# Patient Record
Sex: Male | Born: 1955 | Race: White | Hispanic: No | Marital: Married | Smoking: Never smoker
Health system: Southern US, Community
[De-identification: ages and names within clinical notes are randomized; demographics above are authoritative.]

## PROBLEM LIST (undated history)

## (undated) DIAGNOSIS — G4730 Sleep apnea, unspecified: Secondary | ICD-10-CM

## (undated) DIAGNOSIS — I219 Acute myocardial infarction, unspecified: Secondary | ICD-10-CM

## (undated) DIAGNOSIS — I209 Angina pectoris, unspecified: Secondary | ICD-10-CM

## (undated) DIAGNOSIS — M1990 Unspecified osteoarthritis, unspecified site: Secondary | ICD-10-CM

## (undated) DIAGNOSIS — I2510 Atherosclerotic heart disease of native coronary artery without angina pectoris: Secondary | ICD-10-CM

## (undated) DIAGNOSIS — K219 Gastro-esophageal reflux disease without esophagitis: Secondary | ICD-10-CM

## (undated) DIAGNOSIS — I10 Essential (primary) hypertension: Secondary | ICD-10-CM

## (undated) DIAGNOSIS — E1149 Type 2 diabetes mellitus with other diabetic neurological complication: Secondary | ICD-10-CM

## (undated) DIAGNOSIS — J449 Chronic obstructive pulmonary disease, unspecified: Secondary | ICD-10-CM

---

## 2000-05-15 ENCOUNTER — Observation Stay (HOSPITAL_COMMUNITY): Admission: EM | Admit: 2000-05-15 | Discharge: 2000-05-16 | Payer: Self-pay | Admitting: Cardiology

## 2007-04-19 ENCOUNTER — Emergency Department (HOSPITAL_COMMUNITY): Admission: EM | Admit: 2007-04-19 | Discharge: 2007-04-19 | Payer: Self-pay | Admitting: Emergency Medicine

## 2010-10-21 HISTORY — PX: CORONARY ARTERY BYPASS GRAFT: SHX141

## 2010-10-21 HISTORY — PX: CORONARY ANGIOPLASTY: SHX604

## 2011-03-08 NOTE — Cardiovascular Report (Signed)
[facility]  Patient:    Roy James, Roy James                        MRN: [id number] Proc. Date: 05/16/00 Adm. Date:  [id number] Disc. Date: [id number] Attending:  Learta Codding CC:         [name] Margo Common, M.D., [place], [place]             Lewayne Bunting, M.D.             [facility], [place], [place]             Cardiac Catheterization Lab                        Cardiac Catheterization  PROCEDURE PERFORMED:  Left heart catheterization with coronary angiography and left ventriculography.  INDICATIONS:  Mr. Mozingo is a 55 year old male who was admitted to [facility] with chest pain worrisome for unstable angina.  He was transferred to [facility] for cardiac catheterization.  PROCEDURAL NOTE:  A 6-French sheath was placed in the right femoral artery. Standard Judkins 6-French catheters were utilized.  Contrast was Omnipaque. At the conclusion of the procedure, a Perclose vascular closure device was placed in the right femoral artery with good hemostasis.  There were no complications.  RESULTS:  Hemodynamics:  Left ventricular pressure 116/10.  Aortic pressure 130/72. There was no aortic valve gradient.  Left ventriculogram:  Wall motion is normal.  Ejection fraction is greater than 65%.  No mitral regurgitation.  Coronary arteriography (right dominant): 1. The left main has minor luminal irregularities. 2. The left anterior descending artery has a 30% stenosis in the proximal    vessel.  It gives rise to a normal sized branching first diagonal and a    small second diagonal branch. 3. The left circumflex has a 20% stenosis in the mid vessel.  In the distal    vessel just before OM-3 is a tubular 60-70% stenosis.  The circumflex gives    rise to a small OM-1, small OM-2, and a large OM-3. 4. The right coronary artery is a dominant vessel.  The ostium in the vessel    has a 20% stenosis which may be partly due to catheter-induced spasm.  This    did  improve with nitroglycerin.  In the proximal vessel is a 20% stenosis,    and the mid vessel has a minor luminal irregularity.  The distal right    coronary artery gives rise to a large posterior descending artery, a small    first posterolateral branch, and a normal second posterolateral branch, and    a small third posterolateral branch.  IMPRESSIONS: 1. Normal left ventricular systolic function. 2. Nonobstructive coronary artery disease. DD:  05/16/00 TD:  05/16/00 Job: [id number]

## 2011-03-08 NOTE — Discharge Summary (Signed)
[place]  Patient:    Roy James, Roy James                        MRN: [id number] Adm. Date:  [id number] Disc. Date: [id number] Attending:  Learta Codding Dictator:   Abelino Derrick, P.A.C. [facility]                  Referring Physician Discharge [place]  DISCHARGE DIAGNOSES: 1. Chest pain, worrisome for coronary disease. 2. Nonobstructive coronary disease by catheterization this admission. 3. Obstructive sleep apnea.  HOSPITAL COURSE:  Mr. Thune is a 55 year old male followed by Dr. Wyvonnia Lora.  He has a history of obstructive sleep apnea and is on CPAP at home. He was seen by Dr. Myrtis Ser and Arnette Felts May 14, 2000 in [place].  He was set up for catheterization and further evaluation.  Catheterization was done May 16, 2000 by Dr. Gerri Spore which revealed a 60% mid circumflex narrowing.  He has EF of 65%.  It is felt he has nonobstructive coronary disease.  After admission, he did have an episode where he had some pauses on telemetry, although he was not using his CPAP that he usually uses at home.  Dr. Gerri Spore suggested a 48-hour Holter and follow-up at [facility] with Dr. Myrtis Ser.  DISCHARGE MEDICATIONS: 1. Protonix 40 mg q.d. 2. Aspirin q.d. Marland Kitchen 3. Atenolol 25 mg a day.  LABORATORY DATA:  Sodium 138, potassium 4.1, BUN 13, creatinine 1.0.  Lipid profile shows an LDL of 16, HDL 29.  EKG shows sinus rhythm without acute changes.  CK-MBs and troponins from [facility] are negative.  INR is 1.1.  Sodium 139, potassium 3.7, BUN 14, creatinine 1.4.  White count 12.4, hemoglobin 14.7, hematocrit 43.1, platelets 210.  DISPOSITION:  Patient is discharged in stable condition and will follow up with Dr. Myrtis Ser in [place].  He will have a Holter done.  He will pick this up in the office today. DD:  05/16/00 TD:  05/16/00 Job: [id number]

## 2011-10-22 HISTORY — PX: CARDIAC CATHETERIZATION: SHX172

## 2013-08-17 ENCOUNTER — Encounter: Payer: Self-pay | Admitting: Cardiology

## 2013-08-17 ENCOUNTER — Observation Stay (HOSPITAL_COMMUNITY)
Admit: 2013-08-17 | Discharge: 2013-08-18 | Disposition: A | Discharge status: Home / Self Care | Payer: Self-pay | Source: Other Acute Inpatient Hospital | Attending: Cardiology | Admitting: Cardiology

## 2013-08-17 ENCOUNTER — Encounter (HOSPITAL_COMMUNITY): Payer: Self-pay | Admitting: *Deleted

## 2013-08-17 DIAGNOSIS — E1149 Type 2 diabetes mellitus with other diabetic neurological complication: Secondary | ICD-10-CM | POA: Insufficient documentation

## 2013-08-17 DIAGNOSIS — Z79899 Other long term (current) drug therapy: Secondary | ICD-10-CM | POA: Insufficient documentation

## 2013-08-17 DIAGNOSIS — Z951 Presence of aortocoronary bypass graft: Secondary | ICD-10-CM | POA: Insufficient documentation

## 2013-08-17 DIAGNOSIS — J449 Chronic obstructive pulmonary disease, unspecified: Secondary | ICD-10-CM | POA: Insufficient documentation

## 2013-08-17 DIAGNOSIS — I2510 Atherosclerotic heart disease of native coronary artery without angina pectoris: Principal | ICD-10-CM | POA: Insufficient documentation

## 2013-08-17 DIAGNOSIS — I10 Essential (primary) hypertension: Secondary | ICD-10-CM | POA: Insufficient documentation

## 2013-08-17 DIAGNOSIS — M109 Gout, unspecified: Secondary | ICD-10-CM | POA: Insufficient documentation

## 2013-08-17 DIAGNOSIS — R079 Chest pain, unspecified: Secondary | ICD-10-CM | POA: Insufficient documentation

## 2013-08-17 DIAGNOSIS — Z9861 Coronary angioplasty status: Secondary | ICD-10-CM | POA: Insufficient documentation

## 2013-08-17 DIAGNOSIS — E1142 Type 2 diabetes mellitus with diabetic polyneuropathy: Secondary | ICD-10-CM | POA: Insufficient documentation

## 2013-08-17 DIAGNOSIS — Z23 Encounter for immunization: Secondary | ICD-10-CM | POA: Insufficient documentation

## 2013-08-17 DIAGNOSIS — J4489 Other specified chronic obstructive pulmonary disease: Secondary | ICD-10-CM | POA: Insufficient documentation

## 2013-08-17 DIAGNOSIS — E785 Hyperlipidemia, unspecified: Secondary | ICD-10-CM | POA: Insufficient documentation

## 2013-08-17 DIAGNOSIS — G4733 Obstructive sleep apnea (adult) (pediatric): Secondary | ICD-10-CM | POA: Insufficient documentation

## 2013-08-17 DIAGNOSIS — I209 Angina pectoris, unspecified: Secondary | ICD-10-CM | POA: Insufficient documentation

## 2013-08-17 DIAGNOSIS — E669 Obesity, unspecified: Secondary | ICD-10-CM

## 2013-08-17 HISTORY — DX: Essential (primary) hypertension: I10

## 2013-08-17 HISTORY — DX: Unspecified osteoarthritis, unspecified site: M19.90

## 2013-08-17 HISTORY — DX: Angina pectoris, unspecified: I20.9

## 2013-08-17 HISTORY — DX: Gastro-esophageal reflux disease without esophagitis: K21.9

## 2013-08-17 HISTORY — DX: Atherosclerotic heart disease of native coronary artery without angina pectoris: I25.10

## 2013-08-17 HISTORY — DX: Chronic obstructive pulmonary disease, unspecified: J44.9

## 2013-08-17 HISTORY — DX: Type 2 diabetes mellitus with other diabetic neurological complication: E11.49

## 2013-08-17 HISTORY — DX: Sleep apnea, unspecified: G47.30

## 2013-08-17 MED ORDER — GABAPENTIN 300 MG PO CAPS
300.0000 mg | ORAL_CAPSULE | Freq: Three times a day (TID) | ORAL | Status: DC
  Administered 2013-08-18 (×2): 300 mg via ORAL
  Filled 2013-08-17 (×4): qty 1

## 2013-08-17 MED ORDER — PRASUGREL HCL 10 MG PO TABS
10.0000 mg | ORAL_TABLET | Freq: Every day | ORAL | Status: DC
  Administered 2013-08-18: 11:00:00 10 mg via ORAL
  Filled 2013-08-17: qty 1

## 2013-08-17 MED ORDER — MOMETASONE FURO-FORMOTEROL FUM 100-5 MCG/ACT IN AERO
2.0000 | INHALATION_SPRAY | Freq: Two times a day (BID) | RESPIRATORY_TRACT | Status: DC
  Administered 2013-08-18 (×2): 2 via RESPIRATORY_TRACT
  Filled 2013-08-17: qty 8.8

## 2013-08-17 MED ORDER — ASPIRIN EC 81 MG PO TBEC
81.0000 mg | DELAYED_RELEASE_TABLET | Freq: Every day | ORAL | Status: DC
  Administered 2013-08-18: 81 mg via ORAL
  Filled 2013-08-17: qty 1

## 2013-08-17 MED ORDER — ACETAMINOPHEN 325 MG PO TABS: 650.0000 mg | ORAL_TABLET | ORAL | Status: DC | PRN

## 2013-08-17 MED ORDER — METOPROLOL TARTRATE 25 MG PO TABS
25.0000 mg | ORAL_TABLET | Freq: Two times a day (BID) | ORAL | Status: DC
  Administered 2013-08-18 (×2): 25 mg via ORAL
  Filled 2013-08-17 (×3): qty 1

## 2013-08-17 MED ORDER — INSULIN ASPART 100 UNIT/ML ~~LOC~~ SOLN: 0.0000 [IU] | Freq: Three times a day (TID) | SUBCUTANEOUS | Status: DC

## 2013-08-17 MED ORDER — ONDANSETRON HCL 4 MG/2ML IJ SOLN: 4.0000 mg | Freq: Four times a day (QID) | INTRAMUSCULAR | Status: DC | PRN

## 2013-08-17 MED ORDER — AMLODIPINE BESYLATE 10 MG PO TABS
10.0000 mg | ORAL_TABLET | Freq: Every day | ORAL | Status: DC
  Administered 2013-08-18: 14:00:00 10 mg via ORAL
  Filled 2013-08-17: qty 1

## 2013-08-17 MED ORDER — NITROGLYCERIN 0.4 MG SL SUBL
0.4000 mg | SUBLINGUAL_TABLET | SUBLINGUAL | Status: DC | PRN
  Administered 2013-08-18: 0.4 mg via SUBLINGUAL
  Filled 2013-08-17: qty 25

## 2013-08-17 MED ORDER — SIMVASTATIN 20 MG PO TABS: 20.0000 mg | ORAL_TABLET | Freq: Every day | ORAL | Status: DC

## 2013-08-17 MED ORDER — INFLUENZA VAC SPLIT QUAD 0.5 ML IM SUSP
0.5000 mL | INTRAMUSCULAR | Status: AC
  Administered 2013-08-18: 13:00:00 0.5 mL via INTRAMUSCULAR
  Filled 2013-08-17: qty 0.5

## 2013-08-17 MED ORDER — ISOSORBIDE MONONITRATE ER 30 MG PO TB24
30.0000 mg | ORAL_TABLET | Freq: Every day | ORAL | Status: DC
  Administered 2013-08-18: 30 mg via ORAL
  Filled 2013-08-17: qty 1

## 2013-08-17 MED ORDER — ASPIRIN 81 MG PO CHEW
324.0000 mg | CHEWABLE_TABLET | ORAL | Status: AC
  Administered 2013-08-18: 324 mg via ORAL
  Filled 2013-08-17: qty 4

## 2013-08-17 MED ORDER — LOSARTAN POTASSIUM 25 MG PO TABS
25.0000 mg | ORAL_TABLET | Freq: Every day | ORAL | Status: DC
  Administered 2013-08-18: 11:00:00 25 mg via ORAL
  Filled 2013-08-17: qty 1

## 2013-08-17 MED ORDER — SIMVASTATIN 40 MG PO TABS
40.0000 mg | ORAL_TABLET | Freq: Every day | ORAL | Status: DC
  Filled 2013-08-17: qty 1

## 2013-08-17 NOTE — H&P (Signed)
History and Physical  Patient ID: Roy James MRN: [id number], SOB: Aug 06, 1956 57 y.o. Date of Encounter: 08/17/2013, 10:48 PM  Primary Physician: No primary provider on file. Primary Cardiologist: Dr. Ladell Pier  Chief Complaint: chest pain  HPI: 57 y.o. male w/ PMHx significant for CAD s/p CABG 2012, s/p stents 08/2011, diabetes, HTN who initially presented to [facility] and transferred to  [facility] on 08/17/2013 with complaints of a single episode of chest pain.  He describes it as similar to cardiac chest pain that he has had in the past- substernal, radiating to his left arm and up into his neck, assoc with feeling clammy and shortness of breath. Occurred while he was walking from the kitchen to living room. His wife was concerned also that he didn't look good. Took 1 nitro (old but fizzed) and sought emergent care. Chest pain resolved in the ER after Lovenox 100 subq, morphine 2, nitro paste 1 inch. He has not had recurrence since.  He occasionally gets chest pain but rarely uses a nitro. Mows his yard and helps out with chores and is limited more by shortness of breath.  He has had difficulty affording his medications and has been reliant on his cardiologist for samples. He is a little unclear on which meds he takes but it sounds like he ran out of effient and norvasc in the past two days.  He is currently comfortable and chest pain free.   He wanted to be transferred to [facility] but transport wouldn't cross state lines.  Labs: Glucose 151 Cr 1.3 NA 135 K of 3.5 Troponin neg cpk 73 (nl) bnp 20 inr 0.9 Wbc 9.7 hct 43 plts 201  Cxray: cardiomegaly but no acute   EKG revealed NSR, PVC, with no acute ST changes.  l  PMH: 1. CAD s/p CABG 2012 (3v LIMA to LAD, SVG to OM2, SVG to D1), s/p stent 08/2011 to OM2 2. HTN 3. Diabetes 4. COPD 5. Neuropathy 6. Gout 7. Sleep apnea   Home Meds: From ER records and pt --> he is a little unclear on his  meds.  Prior to Admission medications   Not on File  Metformin 500 qday Aspirin 81 qda advair 1 inh bid Metoprolol 25 bid Amlodipine 10 qd effient 10 mg Losartan 25 mg qd imdur dose  Allergies: Allergies not on file  History   Social History  . Marital Status: Legally Separated    Spouse Name: N/A    Number of Children: N/A  . Years of Education: N/A   Occupational History  . Not on file.   Social History Main Topics  . Smoking status: Not on file  . Smokeless tobacco: Not on file  . Alcohol Use: Not on file  . Drug Use: Not on file  . Sexual Activity: Not on file   Other Topics Concern  . Not on file   Social History Narrative  . No narrative on file     No family history on file.  Review of Systems: General: negative for chills, fever, night sweats or weight changes.  Cardiovascular: see HPI Dermatological: negative for rash Respiratory: negative for cough or wheezing Urologic: negative for hematuria Abdominal: negative for nausea, vomiting, diarrhea, bright red blood per rectum, melena, or hematemesis Neurologic: negative for visual changes, syncope, or dizziness All other systems reviewed and are otherwise negative except as noted above.  Labs:see HPI.   EKG: EKG revealed NSR, PVC, with no acute ST changes.  Physical Exam: Blood  pressure 148/126, temperature 98.4 F (36.9 C), temperature source Oral, resp. rate 18. General: Well developed, well nourished, in no acute distress. Head: Normocephalic, atraumatic, sclera non-icteric, nares are without discharge Neck: Supple. Negative for carotid bruits. JVD not elevated. Lungs: Clear bilaterally to auscultation without wheezes, rales, or rhonchi. Breathing is unlabored. Heart: RRR with S1 S2. No murmurs, rubs, or gallops appreciated. Surgical scar. Abdomen: Soft, non-tender, non-distended with normoactive bowel sounds. Protuberant. No rebound/guarding. No obvious abdominal masses. Msk:  Strength and tone  appear normal for age. Extremities: No edema. No clubbing or cyanosis. Distal pedal pulses are 2+ and equal bilaterally. Neuro: Alert and oriented X 3. Moves all extremities spontaneously. Psych:  Responds to questions appropriately with a normal affect.   Problem List 1. Chest pain, consistent with cardiac angina 2. Known CAD s/p CABG, stent 3. DM 4. HTN 5. Hyperlipidemia 6. COPD 7. Difficulty affording medications.  ASSESSMENT AND PLAN:  57 y.o. male w/ PMHx significant for CAD s/p CABG 2012, s/p stents 08/2011, diabetes, HTN who initially presented to [facility] and transferred to  [facility] on 08/17/2013 with complaints of a single episode of chest pain that is consistent with cardiac angina.  Encouragingly, he has been chest pain free since ER arrival and has negative biomarkers and no EKG changes. Plan is to watch overnight with serial enzymes, telemetry and re-assess in the AM. Received lovenox at outside ER. Without recurrence of symptoms or elevated biomarkers or EKG changes, will not continue lovenox. If he remains chest pain free and biomarker negative, it is reasonable to risk stratify with noninvasive testing in the AM or in the near future as outpatient.  Continue beta blocker, statin, ARB, long acting nitrate, aspirin, prasugrel.  Sliding scale for his diabetes, hold metformin. Gabapentin for neuropathy.  Continue COPD inhalers.   Prophylaxis: Covered by initial lovenox dose. NPO for possible stress.  Full code.  Signed, Adolm Joseph, Troy Sine MD 08/17/2013, 10:48 PM

## 2013-08-18 ENCOUNTER — Encounter (HOSPITAL_COMMUNITY): Payer: Self-pay | Admitting: *Deleted

## 2013-08-18 DIAGNOSIS — E1149 Type 2 diabetes mellitus with other diabetic neurological complication: Secondary | ICD-10-CM

## 2013-08-18 DIAGNOSIS — E785 Hyperlipidemia, unspecified: Secondary | ICD-10-CM

## 2013-08-18 DIAGNOSIS — I209 Angina pectoris, unspecified: Secondary | ICD-10-CM

## 2013-08-18 DIAGNOSIS — E119 Type 2 diabetes mellitus without complications: Secondary | ICD-10-CM | POA: Insufficient documentation

## 2013-08-18 DIAGNOSIS — J449 Chronic obstructive pulmonary disease, unspecified: Secondary | ICD-10-CM

## 2013-08-18 DIAGNOSIS — I2510 Atherosclerotic heart disease of native coronary artery without angina pectoris: Secondary | ICD-10-CM

## 2013-08-18 DIAGNOSIS — E669 Obesity, unspecified: Secondary | ICD-10-CM

## 2013-08-18 DIAGNOSIS — I10 Essential (primary) hypertension: Secondary | ICD-10-CM

## 2013-08-18 LAB — LIPID PANEL
HDL: 47 mg/dL (ref >39)
Triglycerides: 112 mg/dL (ref <150)

## 2013-08-18 LAB — TROPONIN I
Troponin I: <0.3 ng/mL (ref <0.30)
Troponin I: <0.3 ng/mL (ref <0.30)

## 2013-08-18 LAB — CBC
HCT: 40.7 % (ref 39.0–52.0)
Hemoglobin: 13.9 g/dL (ref 13.0–17.0)
MCHC: 34.2 g/dL (ref 30.0–36.0)
MCV: 92.5 fL (ref 78.0–100.0)
Platelets: 190 10*3/uL (ref 150–400)
RDW: 13.9 % (ref 11.5–15.5)
WBC: 8.5 10*3/uL (ref 4.0–10.5)

## 2013-08-18 LAB — BASIC METABOLIC PANEL
Calcium: 8.7 mg/dL (ref 8.4–10.5)
Chloride: 102 mEq/L (ref 96–112)
GFR calc non Af Amer: 65 mL/min — ABNORMAL LOW (ref >90)
Glucose, Bld: 89 mg/dL (ref 70–99)

## 2013-08-18 LAB — GLUCOSE, CAPILLARY
Glucose-Capillary: 94 mg/dL (ref 70–99)
Glucose-Capillary: 94 mg/dL (ref 70–99)

## 2013-08-18 LAB — HEMOGLOBIN A1C
Hgb A1c MFr Bld: 5.7 % — ABNORMAL HIGH (ref <5.7)
Mean Plasma Glucose: 117 mg/dL — ABNORMAL HIGH (ref <117)

## 2013-08-18 MED ORDER — ACTIVE PARTNERSHIP FOR HEALTH OF YOUR HEART BOOK
Freq: Once | Status: AC
  Administered 2013-08-18: 01:00:00
  Filled 2013-08-18 (×2): qty 1

## 2013-08-18 MED ORDER — AMLODIPINE BESYLATE 5 MG PO TABS: 5.0000 mg | ORAL_TABLET | Freq: Every day | ORAL | Status: DC

## 2013-08-18 MED ORDER — SIMVASTATIN 40 MG PO TABS: 40.0000 mg | ORAL_TABLET | Freq: Every day | ORAL | Status: DC

## 2013-08-18 MED ORDER — ALUM & MAG HYDROXIDE-SIMETH 200-200-20 MG/5ML PO SUSP
15.0000 mL | ORAL | Status: DC | PRN
Start: 2013-08-18 — End: 2013-08-18
  Administered 2013-08-18: 08:00:00 15 mL via ORAL
  Filled 2013-08-18: qty 30

## 2013-08-18 MED ORDER — ISOSORBIDE MONONITRATE ER 60 MG PO TB24: 60.0000 mg | ORAL_TABLET | Freq: Every day | ORAL | Status: DC

## 2013-08-18 MED ORDER — NITROGLYCERIN 0.4 MG SL SUBL: 0.4000 mg | SUBLINGUAL_TABLET | SUBLINGUAL | Status: DC | PRN

## 2013-08-18 NOTE — Progress Notes (Signed)
Patient Name: Roy James Date of Encounter: 08/18/2013     Principal Problem:   Angina, class III Active Problems:   CAD (coronary artery disease)   Type 2 diabetes mellitus   Hypertension   Hyperlipidemia   COPD (chronic obstructive pulmonary disease)    SUBJECTIVE: The patient is currently comfortable and pain free. He had an episode of cp this AM, 7/10. Unresponsive to NTG SL x 1. Hypotensive. Given Maalox. Eventually reduced to 5/10.   He reports undergoing cardiac cath in [place] within the past year which was "normal." He did not receive at stent. He reports experiencing substernal dull chest pain for months which occur once/week and last 15-20 minutes. Typically occuring at rest, but occasionally on exertion. Each episode responds to NTG x 1-2. He had another episode yesterday with radiation to his neck and back with associated dyspnea and diaphoresis. He took an old NTG SL with some improvement. He thought the episode was initially a COPD flare. His wife was concerned and he presented to the [place]. Requested transfer to [place], but unable to cross state lines.   OBJECTIVE  Filed Vitals:   08/18/13 0736 08/18/13 0752 08/18/13 0753 08/18/13 0800  BP: 99/71 89/45 95/46  103/67  Pulse: 72 76 82 63  Temp:    98.4 F (36.9 C)  TempSrc:    Oral  Resp:    18  Height:      Weight:      SpO2: 97% 94% 93% 95%    Intake/Output Summary (Last 24 hours) at 08/18/13 1048 Last data filed at 08/18/13 4098  Gross per 24 hour  Intake     20 ml  Output    825 ml  Net   -805 ml   Weight change:   PHYSICAL EXAM  General: Well developed, obese appearing male in no acute distress. Head: Normocephalic, atraumatic, sclera non-icteric, no xanthomas, nares are without discharge.  Neck: Supple without bruits or JVD. Lungs:  Resp regular and unlabored, CTAB w/o wheezes, rales or rhonchi Heart: Chest discomfort on palpation, different in character from presenting chest  pain. RRR no s3, s4, or murmurs. Abdomen: Soft, non-tender, non-distended, BS + x 4.  Msk:  Strength and tone appears normal for age. Extremities: No clubbing, cyanosis or edema. DP/PT/Radials 2+ and equal bilaterally. Neuro: Alert and oriented X 3. Moves all extremities spontaneously. Psych: Normal affect.  LABS:  Recent Labs     08/18/13  0452  WBC  8.5  HGB  13.9  HCT  40.7  MCV  92.5  PLT  190    Recent Labs Lab 08/18/13 0452  NA 138  K 4.2  CL 102  CO2 24  BUN 18  CREATININE 1.22  CALCIUM 8.7  GLUCOSE 89   Recent Labs     08/17/13  2351  08/18/13  0517  TROPONINI  <0.30  <0.30   Recent Labs     08/18/13  0452  CHOL  192  HDL  47  LDLCALC  123*  TRIG  112  CHOLHDL  4.1   TELE: NSR, HR 70-80s  ECG: NSR, 62 bpm, no ST/T changes, inferior IVCD   Radiology/Studies:  No results found.  Current Medications:  . amLODipine  10 mg Oral Daily  . aspirin EC  81 mg Oral Daily  . gabapentin  300 mg Oral TID  . influenza vac split quadrivalent PF  0.5 mL Intramuscular Tomorrow-1000  . insulin aspart  0-15 Units Subcutaneous TID WC  .  isosorbide mononitrate  30 mg Oral Daily  . losartan  25 mg Oral Daily  . metoprolol tartrate  25 mg Oral BID  . mometasone-formoterol  2 puff Inhalation BID  . prasugrel  10 mg Oral Daily  . simvastatin  40 mg Oral q1800    ASSESSMENT AND PLAN:  1. Angina, class III- the patient experiences consistent chest pressure mostly while at rest once per week which responds to NTG SL. He presented to the ED yesterday after his wife became concerned. He reports these episodes have not become more frequent or severe. He states he had a cardiac cath in [place], [place] within a year which was "normal." No PCI performed; however, he is on Effient (indefinite DAPT?). He receives cardiac care up there. He is on multiple antianginals suggesting chronic angina.  -- Plan outpatient follow-up with cardiologist to consider noninvasive ischemic  eval. D/c today.  -- Increase Imdur to 60mg  daily -- Reduce amlodipine to 5mg  give more room for BP  2. CAD s/p CABG, PCI in 2012- ruled out overnight. No ischemic changes on EKG this AM. Chest discomfort this AM. Unresponsive to NTG. Unclear that this morning's episode was cardiac pain. Did eventually abate after Maalox given. Did not feel like indigestion however. He also has COPD and diabetic neuropathy which could attribute to his discomfort, dyspnea.  -- Continue ASA, Effient, ARB, BB, statin, Imdur, Norvasc -- Upgrade to high-intensity statin (on simvastatin at home)  3. COPD- no cough, fevers, chills or wheezing. Stable.  -- Continue current management  4. Type 2 DM with diabetic neuropathy- on metformin at home.  -- Follow-up PCP -- Continue gapapentin  5. Hypertension- actually labile BPs this AM.  -- Norvasc adjustment as above to accommodate increasing Imdur.      Signed, R. Hurman Horn, PA-C 08/18/2013, 10:48 AM

## 2013-08-18 NOTE — Progress Notes (Signed)
   I have seen and examined the patient. I agree with the above note with the addition of : The patient reports chronic chest pain after his CABG. Cardiac cath done early this year showed no obstructive disease (per patient).  He ruled out for MI overnight with no recurrent chest pain. ECG is normal. I discussed management options with him including inpatient vs. outpatient ischemic evaluation. He prefers outpatient work up which I think is reasonable. Recommend a nuclear stress test done within 1 week. He wants to have this done with his cardiologist in [place]. Continue medical therapy for CAD.   Lorine Bears MD, [facility] 08/18/2013 3:11 PM

## 2013-08-18 NOTE — Discharge Summary (Signed)
Discharge Summary   Patient ID: Roy James,  MRN: [id number], DOB/AGE: 1955/12/20 57 y.o.  Admit date: 08/17/2013 Discharge date: 08/18/2013  Primary Physician: No primary provider on file. Primary Cardiologist: Dr. Darlyn James in [place], [place]  Discharge Diagnoses  Principal Problem:   Angina, class III  - Increase Imdur to 60mg  daily  - Reduce amlodipine to 64m daily  - Follow-up with Dr. Catha James on 08/25/13  Active Problems:   CAD (coronary artery disease)  - Trop-I WNL x 3  - No ischemic EKG changes   Hypertension  - Antihypertensive adjustments as above   Hyperlipidemia  - LDL 123, HDL 47, TG 112, TC 192  - Continue simvasatin 40 for now (consider upgrading to high-intensity statin given new guidelines and risk of myopathy at simvastatin doses > 20mg  daily with amlodipine)   COPD (chronic obstructive pulmonary disease)   Obesity   Type 2 diabetes mellitus with neurological manifestations  Allergies Allergies  Allergen Reactions  . Penicillins     Diagnostic Studies/Procedures  None  History of Present Illness  Roy James is a 57 y.o. Caucasian male who was observed overnight at [facility] 10/28 to 08/18/2013 with the above problem list.   He has a history of CAD s/p CABG & subsequent PCI in 2012, DM2 with diabetic neuropathy, HTN, HLD, obesity, OSA and COPD. He has had difficulties affording certain medications. He follows with Dr. Catha James in [place], [place].   He reported undergoing cardiac cath in [place] within the past year which was "normal." He did not receive at stent. He endorses substernal dull chest pain for months which occur once/week and last 15-20 minutes. Typically occuring at rest, but occasionally on exertion. Each episode responds to NTG x 1-2. He had another episode 10/28 with radiation to his neck and back and associated dyspnea and diaphoresis. He took an old NTG SL with some improvement. He thought the  episode was initially a COPD flare. His wife was concerned and he presented to the [place]. Requested transfer to [place], but unable to cross state lines, so was transferred to [facility].   There, EKG indicated NSR with no acute ischemic changes. Initial TnI returned WNL. BMET and CBC were largely WNL. Lipid panel was obtained revealing LDL 123, HDL 47, TG 112, TC 192. Given his cardiac history, RFs and HPI c/w angina, he was observed overnight for formal rule.   Hospital Course   He was continued on his home medications. A subsequent troponin returned WNL. He did have an episode of chest pain this morning rated at a 7/10. He took NTG SL with minimal relief. BP was soft in the 90/60s so a subsequent NTG was not administered. He was instead given Maalox with eventual relief. EKG this morning indicated no ST/T changes. A final troponin returned WNL. His symptoms were consistent with chronic stable angina. With a "normal" cardiac cath earlier this year, formal rule out this hospitalization and no true change in the frequency or severity of the patient's chest discomfort, the plan was made for discharge and to follow-up with his cardiologist within 1 week to consider the utility of noninvasive ischemic evaluation. He is on several antianginals- Imdur, metoprolol, amlodipine- and the strategy may be to treat his chronic angina. NTG typically improves the chest discomfort, therefore we have increase Imdur to 60mg  daily and reduced amlodipine to 5mg  daily to give room from a BP standpoint. He will be continued on all home medications.   Of  note, he is on simvastatin and amlodipine. There is increased risk of statin-induced myopathy with simvastatin > 20mg  and concomitant amlodipine use. Given the new ACC/AHA guidelines, the patient may benefit from upgrading to a high-intensity statin such as atorvastatin 80 or rosuvastatin 20. Will leave to the discretion of the patient's cardiologist. He has a previously  scheduled appointment on 11/5. This will be kept.    This information has been clearly explained to the patient and reflected in the discharge AVS.   Discharge Vitals:  Blood pressure 105/59, pulse 85, temperature 98.4 F (36.9 C), temperature source Oral, resp. rate 18, height 6' (1.829 m), weight 105.5 kg (232 lb 9.4 oz), SpO2 97.00%.   Labs: Recent Labs     08/18/13  0452  WBC  8.5  HGB  13.9  HCT  40.7  MCV  92.5  PLT  190    Recent Labs Lab 08/18/13 0452  NA 138  K 4.2  CL 102  CO2 24  BUN 18  CREATININE 1.22  CALCIUM 8.7  GLUCOSE 89   Recent Labs     08/17/13  2351  08/18/13  0517  08/18/13  1100  TROPONINI  <0.30  <0.30  <0.30   Recent Labs     08/18/13  0452  CHOL  192  HDL  47  LDLCALC  123*  TRIG  112  CHOLHDL  4.1   Disposition:  Discharge Orders   Future Orders Complete By Expires   Diet - low sodium heart healthy  As directed    Increase activity slowly  As directed          Follow-up Information   Follow up On 08/25/2013. (You have an appointment previously scheduled with your primary cardiologist Dr. Catha James on this date. Please keep this appointment. Call the office if you are unsure of the time. )      Discharge Medications:    Medication List         amLODipine 5 MG tablet  Commonly known as:  NORVASC  Take 1 tablet (5 mg total) by mouth daily.     aspirin EC 81 MG tablet  Take 81 mg by mouth daily.     Fluticasone-Salmeterol 250-50 MCG/DOSE Aepb  Commonly known as:  ADVAIR  Inhale 1 puff into the lungs every 12 (twelve) hours.     gabapentin 300 MG capsule  Commonly known as:  NEURONTIN  Take 300 mg by mouth 3 (three) times daily.     isosorbide mononitrate 60 MG 24 hr tablet  Commonly known as:  IMDUR  Take 1 tablet (60 mg total) by mouth daily.     losartan 25 MG tablet  Commonly known as:  COZAAR  Take 25 mg by mouth daily.     metFORMIN 500 MG tablet  Commonly known as:  GLUCOPHAGE  Take 500 mg by mouth  daily with breakfast.     metoprolol tartrate 25 MG tablet  Commonly known as:  LOPRESSOR  Take 25 mg by mouth 2 (two) times daily.     nitroGLYCERIN 0.4 MG SL tablet  Commonly known as:  NITROSTAT  Place 1 tablet (0.4 mg total) under the tongue every 5 (five) minutes x 3 doses as needed for chest pain.     prasugrel 10 MG Tabs tablet  Commonly known as:  EFFIENT  Take 10 mg by mouth daily.     simvastatin 40 MG tablet  Commonly known as:  ZOCOR  Take 1 tablet (40  mg total) by mouth daily at 6 PM.       Outstanding Labs/Studies: None  Duration of Discharge Encounter: Greater than 30 minutes including physician time.  Signed, R. Hurman Horn, PA-C 08/18/2013, 1:36 PM

## 2013-08-18 NOTE — Progress Notes (Signed)
Pt c/o right chest pain 7/10 present upon waking this AM. Given 0.4 nitro SL with BP 99/71(80) at 0744. At 5 min, pt stated pain was the same, but BP was 89/45(60). Given maalox. At 0800 BP up to 103/67(78), offered pt second nitro pill but pt said pain had decreased to 5/10 and that he no longer needed the medication. During assessment with RN at bedside pt fell asleep 3 times throughout chest pain.   EKG from this AM has no acute changes/markers, troponins so far are negative. Will monitor.   Delynn Flavin, RN

## 2013-08-18 NOTE — Progress Notes (Signed)
Pt discharged per cardiac team with medical management and follow up with primary cardiologist. Discharge education done, pt had no questions at this time. Rx's given to pt. Denies chest pain.   Pt wheeled to wife (currently a patient in the ER) by NT.   Delynn Flavin, RN

## 2014-06-22 ENCOUNTER — Inpatient Hospital Stay (HOSPITAL_COMMUNITY)
Admission: EM | Admit: 2014-06-22 | Discharge: 2014-06-24 | DRG: 287 | Disposition: A | Discharge status: Home / Self Care | Payer: Medicare Other | Attending: Interventional Cardiology | Admitting: Interventional Cardiology

## 2014-06-22 ENCOUNTER — Emergency Department (HOSPITAL_COMMUNITY): Payer: Medicare Other

## 2014-06-22 ENCOUNTER — Encounter (HOSPITAL_COMMUNITY): Payer: Self-pay | Admitting: Emergency Medicine

## 2014-06-22 DIAGNOSIS — Z7982 Long term (current) use of aspirin: Secondary | ICD-10-CM

## 2014-06-22 DIAGNOSIS — I200 Unstable angina: Secondary | ICD-10-CM | POA: Diagnosis not present

## 2014-06-22 DIAGNOSIS — I209 Angina pectoris, unspecified: Secondary | ICD-10-CM

## 2014-06-22 DIAGNOSIS — E785 Hyperlipidemia, unspecified: Secondary | ICD-10-CM | POA: Diagnosis present

## 2014-06-22 DIAGNOSIS — K219 Gastro-esophageal reflux disease without esophagitis: Secondary | ICD-10-CM | POA: Diagnosis present

## 2014-06-22 DIAGNOSIS — I252 Old myocardial infarction: Secondary | ICD-10-CM

## 2014-06-22 DIAGNOSIS — M129 Arthropathy, unspecified: Secondary | ICD-10-CM | POA: Diagnosis present

## 2014-06-22 DIAGNOSIS — I25110 Atherosclerotic heart disease of native coronary artery with unstable angina pectoris: Secondary | ICD-10-CM

## 2014-06-22 DIAGNOSIS — Z951 Presence of aortocoronary bypass graft: Secondary | ICD-10-CM

## 2014-06-22 DIAGNOSIS — I10 Essential (primary) hypertension: Secondary | ICD-10-CM | POA: Diagnosis present

## 2014-06-22 DIAGNOSIS — G4730 Sleep apnea, unspecified: Secondary | ICD-10-CM | POA: Diagnosis present

## 2014-06-22 DIAGNOSIS — E1149 Type 2 diabetes mellitus with other diabetic neurological complication: Secondary | ICD-10-CM | POA: Diagnosis present

## 2014-06-22 DIAGNOSIS — J4489 Other specified chronic obstructive pulmonary disease: Secondary | ICD-10-CM | POA: Diagnosis present

## 2014-06-22 DIAGNOSIS — Z9861 Coronary angioplasty status: Secondary | ICD-10-CM

## 2014-06-22 DIAGNOSIS — J449 Chronic obstructive pulmonary disease, unspecified: Secondary | ICD-10-CM | POA: Diagnosis present

## 2014-06-22 DIAGNOSIS — I2510 Atherosclerotic heart disease of native coronary artery without angina pectoris: Secondary | ICD-10-CM | POA: Diagnosis present

## 2014-06-22 DIAGNOSIS — E669 Obesity, unspecified: Secondary | ICD-10-CM

## 2014-06-22 HISTORY — DX: Acute myocardial infarction, unspecified: I21.9

## 2014-06-22 LAB — CBC WITH DIFFERENTIAL/PLATELET
Basophils Absolute: 0.1 10*3/uL (ref 0.0–0.1)
Basophils Relative: 1 % (ref 0–1)
Eosinophils Absolute: 0.2 10*3/uL (ref 0.0–0.7)
Eosinophils Relative: 2 % (ref 0–5)
HEMATOCRIT: 42.2 % (ref 39.0–52.0)
Hemoglobin: 14.2 g/dL (ref 13.0–17.0)
LYMPHS ABS: 2 10*3/uL (ref 0.7–4.0)
Lymphocytes Relative: 22 % (ref 12–46)
MCH: 31.6 pg (ref 26.0–34.0)
MCHC: 33.6 g/dL (ref 30.0–36.0)
MCV: 94 fL (ref 78.0–100.0)
MONO ABS: 0.9 10*3/uL (ref 0.1–1.0)
Monocytes Relative: 9 % (ref 3–12)
NEUTROS ABS: 6.2 10*3/uL (ref 1.7–7.7)
Neutrophils Relative %: 66 % (ref 43–77)
Platelets: 230 10*3/uL (ref 150–400)
RBC: 4.49 MIL/uL (ref 4.22–5.81)
RDW: 14.2 % (ref 11.5–15.5)
WBC: 9.3 10*3/uL (ref 4.0–10.5)

## 2014-06-22 LAB — I-STAT CHEM 8, ED
BUN: 13 mg/dL (ref 6–23)
CALCIUM ION: 1.16 mmol/L (ref 1.12–1.23)
CHLORIDE: 107 meq/L (ref 96–112)
Creatinine, Ser: 1.4 mg/dL — ABNORMAL HIGH (ref 0.50–1.35)
GLUCOSE: 95 mg/dL (ref 70–99)
HEMATOCRIT: 45 % (ref 39.0–52.0)
Hemoglobin: 15.3 g/dL (ref 13.0–17.0)
POTASSIUM: 4.2 meq/L (ref 3.7–5.3)
Sodium: 139 mEq/L (ref 137–147)
TCO2: 23 mmol/L (ref 0–100)

## 2014-06-22 LAB — COMPREHENSIVE METABOLIC PANEL
ALK PHOS: 58 U/L (ref 39–117)
ALT: 14 U/L (ref 0–53)
ANION GAP: 14 (ref 5–15)
AST: 13 U/L (ref 0–37)
Albumin: 3.7 g/dL (ref 3.5–5.2)
BILIRUBIN TOTAL: 0.4 mg/dL (ref 0.3–1.2)
BUN: 14 mg/dL (ref 6–23)
CHLORIDE: 103 meq/L (ref 96–112)
CO2: 23 meq/L (ref 19–32)
Calcium: 9.2 mg/dL (ref 8.4–10.5)
Creatinine, Ser: 1.31 mg/dL (ref 0.50–1.35)
GFR calc Af Amer: 68 mL/min — ABNORMAL LOW (ref >90)
GFR, EST NON AFRICAN AMERICAN: 59 mL/min — AB (ref >90)
Glucose, Bld: 94 mg/dL (ref 70–99)
Potassium: 4.4 mEq/L (ref 3.7–5.3)
Sodium: 140 mEq/L (ref 137–147)
Total Protein: 6.8 g/dL (ref 6.0–8.3)

## 2014-06-22 LAB — I-STAT TROPONIN, ED: TROPONIN I, POC: 0 ng/mL (ref 0.00–0.08)

## 2014-06-22 LAB — PRO B NATRIURETIC PEPTIDE: Pro B Natriuretic peptide (BNP): 113.2 pg/mL (ref 0–125)

## 2014-06-22 MED ORDER — NITROGLYCERIN IN D5W 200-5 MCG/ML-% IV SOLN
2.0000 ug/min | INTRAVENOUS | Status: DC
  Administered 2014-06-22: 5 ug/min via INTRAVENOUS
  Filled 2014-06-22: qty 250

## 2014-06-22 MED ORDER — ASPIRIN EC 325 MG PO TBEC
325.0000 mg | DELAYED_RELEASE_TABLET | Freq: Once | ORAL | Status: AC
Start: 2014-06-22 — End: 2014-06-22
  Administered 2014-06-22: 325 mg via ORAL
  Filled 2014-06-22: qty 1

## 2014-06-22 MED ORDER — HEPARIN BOLUS VIA INFUSION
4000.0000 [IU] | Freq: Once | INTRAVENOUS | Status: AC
  Administered 2014-06-22: 4000 [IU] via INTRAVENOUS
  Filled 2014-06-22: qty 4000

## 2014-06-22 MED ORDER — HEPARIN SODIUM (PORCINE) 5000 UNIT/ML IJ SOLN: 60.0000 [IU]/kg | Freq: Once | INTRAMUSCULAR | Status: DC

## 2014-06-22 MED ORDER — HEPARIN (PORCINE) IN NACL 100-0.45 UNIT/ML-% IJ SOLN
1200.0000 [IU]/h | INTRAMUSCULAR | Status: DC
  Administered 2014-06-22: 1200 [IU]/h via INTRAVENOUS
  Filled 2014-06-22 (×2): qty 250

## 2014-06-22 NOTE — ED Provider Notes (Signed)
CSN: [id number]     Arrival date & time 06/22/14  2210 History   First MD Initiated Contact with Patient 06/22/14 2215     Chief Complaint  Patient presents with  . Chest Pain     (Consider location/radiation/quality/duration/timing/severity/associated sxs/prior Treatment) Patient is a 58 y.o. male presenting with chest pain.  Chest Pain Pain location:  Substernal area Pain quality: pressure   Pain radiates to:  Does not radiate Pain radiates to the back: no   Pain severity:  Moderate Onset quality:  Gradual Duration:  3 hours Timing:  Constant Progression:  Worsening Chronicity:  Recurrent ("like my last heart attach") Context comment:  Patient was walking into the hospital because his wife was admitted to the hospital with a reported CVA Relieved by:  Nitroglycerin and rest Worsened by:  Exertion Ineffective treatments:  None tried Associated symptoms: diaphoresis, nausea and shortness of breath   Associated symptoms: no abdominal pain, no altered mental status, no anorexia, no anxiety, no back pain, no claudication, no cough, no dizziness, no fatigue, no headache, no heartburn, no lower extremity edema, no numbness, no orthopnea, not vomiting and no weakness     Past Medical History  Diagnosis Date  . Coronary artery disease     a. s/p CABG, subsequent PCI in 2012 b. "normal" cardiac cath in 2013  . Hypertension   . COPD (chronic obstructive pulmonary disease)   . Type 2 diabetes mellitus with neurological manifestations   . Angina, class III   . Sleep apnea   . GERD (gastroesophageal reflux disease)   . Arthritis   . MI (myocardial infarction)    Past Surgical History  Procedure Laterality Date  . Coronary angioplasty  2012  . Coronary artery bypass graft  2012  . Cardiac catheterization  2013    "normal" per patient   History reviewed. No pertinent family history. History  Substance Use Topics  . Smoking status: Never Smoker   . Smokeless tobacco: Never Used   . Alcohol Use: No    Review of Systems  Constitutional: Positive for diaphoresis. Negative for fatigue.  Respiratory: Positive for shortness of breath. Negative for cough.   Cardiovascular: Positive for chest pain. Negative for orthopnea and claudication.  Gastrointestinal: Positive for nausea. Negative for heartburn, vomiting, abdominal pain and anorexia.  Musculoskeletal: Negative for back pain.  Neurological: Negative for dizziness, weakness, numbness and headaches.  All other systems reviewed and are negative.     Allergies  Penicillins  Home Medications   Prior to Admission medications   Medication Sig Start Date End Date Taking? Authorizing Provider  albuterol (PROVENTIL HFA;VENTOLIN HFA) 108 (90 BASE) MCG/ACT inhaler Inhale 2 puffs into the lungs every 6 (six) hours as needed for wheezing or shortness of breath.   Yes Historical Provider, MD  aspirin EC 81 MG tablet Take 81 mg by mouth daily.   Yes Historical Provider, MD  atorvastatin (LIPITOR) 80 MG tablet Take 40 mg by mouth daily at 6 PM.   Yes Historical Provider, MD  budesonide-formoterol (SYMBICORT) 160-4.5 MCG/ACT inhaler Inhale 2 puffs into the lungs 2 (two) times daily.   Yes Historical Provider, MD  isosorbide mononitrate (IMDUR) 60 MG 24 hr tablet Take 60 mg by mouth daily.   Yes Historical Provider, MD  lisinopril (PRINIVIL,ZESTRIL) 20 MG tablet Take 10 mg by mouth daily.   Yes Historical Provider, MD  metFORMIN (GLUCOPHAGE) 500 MG tablet Take 500 mg by mouth daily with breakfast.   Yes Historical Provider, MD  metoprolol tartrate (LOPRESSOR) 25 MG tablet Take 25 mg by mouth 2 (two) times daily.   Yes Historical Provider, MD  nitroGLYCERIN (NITROSTAT) 0.4 MG SL tablet Place 1 tablet (0.4 mg total) under the tongue every 5 (five) minutes x 3 doses as needed for chest pain. 08/18/13  Yes Roger A Arguello, PA-C  omeprazole (PRILOSEC) 20 MG capsule Take 20 mg by mouth daily.   Yes Historical Provider, MD  ranolazine  (RANEXA) 1000 MG SR tablet Take 1,000 mg by mouth 2 (two) times daily.   Yes Historical Provider, MD  ranolazine (RANEXA) 500 MG 12 hr tablet Take 1,000 mg by mouth 2 (two) times daily.   Yes Historical Provider, MD  sucralfate (CARAFATE) 1 G tablet Take 1 g by mouth 4 (four) times daily -  with meals and at bedtime.   Yes Historical Provider, MD  ticagrelor (BRILINTA) 90 MG TABS tablet Take 90 mg by mouth 2 (two) times daily.   Yes Historical Provider, MD   BP 123/85  Pulse 78  Resp 17  SpO2 98% Physical Exam  Nursing note and vitals reviewed. Constitutional: He is oriented to person, place, and time. He appears well-developed and well-nourished. No distress.  HENT:  Head: Normocephalic and atraumatic.  Eyes: Conjunctivae and EOM are normal. Right eye exhibits no discharge. Left eye exhibits no discharge.  Neck: Normal range of motion. Neck supple. No tracheal deviation present.  Cardiovascular: Normal rate, regular rhythm and normal heart sounds.  Exam reveals no friction rub.   No murmur heard. Pulmonary/Chest: Effort normal and breath sounds normal. No stridor. No respiratory distress. He has no wheezes. He has no rales. He exhibits no tenderness.  Abdominal: Soft. He exhibits no distension. There is no tenderness. There is no rebound and no guarding.  Neurological: He is alert and oriented to person, place, and time.  Skin: Skin is warm. He is diaphoretic.  Psychiatric: He has a normal mood and affect.    ED Course  Procedures (including critical care time) Labs Review Labs Reviewed  COMPREHENSIVE METABOLIC PANEL - Abnormal; Notable for the following:    GFR calc non Af Amer 59 (*)    GFR calc Af Amer 68 (*)    All other components within normal limits  I-STAT CHEM 8, ED - Abnormal; Notable for the following:    Creatinine, Ser 1.40 (*)    All other components within normal limits  CBC WITH DIFFERENTIAL  PRO B NATRIURETIC PEPTIDE  HEPARIN LEVEL (UNFRACTIONATED)  CBC   I-STAT TROPOININ, ED  Rosezena Sensor, ED    Imaging Review Dg Chest Portable 1 View  06/22/2014   CLINICAL DATA:  CHEST PAIN  EXAM: PORTABLE CHEST - 1 VIEW  COMPARISON:  08/17/2013  FINDINGS: Previous CABG.  Mild cardiomegaly, stable  .  Lungs are clear. No effusion.  IMPRESSION: 1. Stable cardiomegaly.  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   Electronically Signed   By: [name] M.D.   On: 06/22/2014 22:53     EKG Interpretation   Date/Time:  Wednesday June 22 2014 22:10:43 EDT Ventricular Rate:  78 PR Interval:  169 QRS Duration: 79 QT Interval:  363 QTC Calculation: 413 R Axis:   48 Text Interpretation:  Normal sinus rhythm Normal ECG since last tracing no  significant change Confirmed by MILLER  MD, BRIAN ([id number]) on 06/22/2014  10:32:49 PM      MDM   Final diagnoses:  None    Pt with CAD, s/p CABG and stents  presents for typical chest pain (central substernal chest pain, that radiated to his jaw, made him nauseated and diaphoretic) and improved with ntg sl X3 BUT DID NOT RESOLVED. ECG neg for acute STEMI. Pain ongoing. Given his risk factors started a NTG gtt, and anticoagulated with heparin. Trop neg, but pain started about 2-3 hours ago and may not be elevated with ACS at this point. CXR neg for PNA, PTX. ASA given as well. Admission for unstable angina on a NTG gtt. Plan to admit. Care discussed with my attending, Dr. Hyacinth Meeker. Images from radiologic studies reviewed by myself and information was used in my MDM. Will admit to cardiology for unstable angina.    Sena Hitch, MD 06/22/14 [phone]

## 2014-06-22 NOTE — ED Notes (Signed)
Pt visiting another pt in the hospital.  Pt noted to be diaphoretic and pale with RN.  Pt stated he had been having chest pain for about an hr.  Pt reports taking 3 nitros within the last hour.  Pain currently 6/10; initially 10/10.

## 2014-06-22 NOTE — ED Provider Notes (Signed)
58 year old male, presents after having subacute onset of chest pain, shortness of breath and diaphoresis as he was walking through the hospital as his spouse has just been admitted to the hospital. He has no significant heart disease with multiple interventions in the past. He took 3 nitroglycerin within an hour, on exam has clear heart and lung sounds, no peripheral edema but appears uncomfortable. His EKG is nonischemic, initial troponin is negative, renal status with creatinine of 1.3 and a normal BNP. Interpretation shows no signs of acute pulmonary edema, normal mediastinum, normal soft tissues, no pneumothorax or infiltrates.  I saw and evaluated the patient, reviewed the resident's note and I agree with the findings and plan.   EKG Interpretation  Date/Time:  Wednesday June 22 2014 22:10:43 EDT Ventricular Rate:  78 PR Interval:  169 QRS Duration: 79 QT Interval:  363 QTC Calculation: 413 R Axis:   48 Text Interpretation:  Normal sinus rhythm Normal ECG since last tracing no significant change Confirmed by Sipriano Fendley  MD, Taylr Meuth ([id number]) on 06/22/2014 10:32:49 PM       Trop neg, xray neg, labs show renal insufficiency.  Pt has s/s consistent with ACS and will need cardiology evaluation.  Nitro gtt adn Heparin gtt started - pharmacy consult, cardiology consult.  CRITICAL CARE Performed by: Vida Roller Total critical care time: 30 Critical care time was exclusive of separately billable procedures and treating other patients. Critical care was necessary to treat or prevent imminent or life-threatening deterioration. Critical care was time spent personally by me on the following activities: development of treatment plan with patient and/or surrogate as well as nursing, discussions with consultants, evaluation of patient's response to treatment, examination of patient, obtaining history from patient or surrogate, ordering and performing treatments and interventions, ordering and review of  laboratory studies, ordering and review of radiographic studies, pulse oximetry and re-evaluation of patient's condition.   Diagnosis:  #1 - Unstable Angina #2 - Renal Insufficiency  I personally interpreted the EKG as well as the resident and agree with the interpretation on the resident's chart.   Vida Roller, MD 06/23/14 [phone]

## 2014-06-22 NOTE — Progress Notes (Signed)
ANTICOAGULATION CONSULT NOTE - Initial Consult  Pharmacy Consult for Heparin Indication: chest pain/ACS  Allergies  Allergen Reactions  . Penicillins Other (See Comments)    unknown    Patient Measurements:   Actual Body Weight: 100 kg Ideal Body Weight: 77.6 kg Heparin Dosing Weight: 98 kg  Vital Signs: BP: 123/85 mmHg (09/02 2215) Pulse Rate: 78 (09/02 2215)  Labs:  Recent Labs  06/22/14 2211 06/22/14 2222  HGB 14.2 15.3  HCT 42.2 45.0  PLT 230  --   CREATININE  --  1.40*    The CrCl is unknown because both a height and weight (above a minimum accepted value) are required for this calculation.   Medical History: Past Medical History  Diagnosis Date  . Coronary artery disease     a. s/p CABG, subsequent PCI in 2012 b. "normal" cardiac cath in 2013  . Hypertension   . COPD (chronic obstructive pulmonary disease)   . Type 2 diabetes mellitus with neurological manifestations   . Angina, class III   . Sleep apnea   . GERD (gastroesophageal reflux disease)   . Arthritis   . MI (myocardial infarction)     Assessment: 58yo male to start heparin for chest pain/ACS rule out. H/H, Plt wnl.  Goal of Therapy:  Heparin level 0.3-0.7 units/ml Monitor platelets by anticoagulation protocol: Yes   Plan:  Give 4000 units bolus x 1 Start heparin infusion at 1200 units/hr Check anti-Xa level in 6 hours and daily while on heparin Continue to monitor H&H and platelets  Arlean Hopping. Newman Pies, PharmD Clinical Pharmacist Pager [phone] 06/22/2014,10:35 PM

## 2014-06-22 NOTE — ED Notes (Signed)
Per Cardiologist, pt may eat till 2 am and then will become NPO.

## 2014-06-23 ENCOUNTER — Encounter (HOSPITAL_COMMUNITY)
Admission: EM | Disposition: A | Discharge status: Home / Self Care | Payer: Self-pay | Source: Home / Self Care | Attending: Interventional Cardiology

## 2014-06-23 DIAGNOSIS — K219 Gastro-esophageal reflux disease without esophagitis: Secondary | ICD-10-CM | POA: Diagnosis present

## 2014-06-23 DIAGNOSIS — Z951 Presence of aortocoronary bypass graft: Secondary | ICD-10-CM

## 2014-06-23 DIAGNOSIS — E785 Hyperlipidemia, unspecified: Secondary | ICD-10-CM

## 2014-06-23 DIAGNOSIS — I200 Unstable angina: Secondary | ICD-10-CM

## 2014-06-23 DIAGNOSIS — M129 Arthropathy, unspecified: Secondary | ICD-10-CM | POA: Diagnosis present

## 2014-06-23 DIAGNOSIS — I2510 Atherosclerotic heart disease of native coronary artery without angina pectoris: Secondary | ICD-10-CM

## 2014-06-23 DIAGNOSIS — I252 Old myocardial infarction: Secondary | ICD-10-CM | POA: Diagnosis not present

## 2014-06-23 DIAGNOSIS — J449 Chronic obstructive pulmonary disease, unspecified: Secondary | ICD-10-CM | POA: Diagnosis present

## 2014-06-23 DIAGNOSIS — G4730 Sleep apnea, unspecified: Secondary | ICD-10-CM | POA: Diagnosis present

## 2014-06-23 DIAGNOSIS — E1149 Type 2 diabetes mellitus with other diabetic neurological complication: Secondary | ICD-10-CM | POA: Diagnosis present

## 2014-06-23 DIAGNOSIS — I10 Essential (primary) hypertension: Secondary | ICD-10-CM | POA: Diagnosis present

## 2014-06-23 DIAGNOSIS — Z9861 Coronary angioplasty status: Secondary | ICD-10-CM | POA: Diagnosis not present

## 2014-06-23 DIAGNOSIS — Z7982 Long term (current) use of aspirin: Secondary | ICD-10-CM | POA: Diagnosis not present

## 2014-06-23 HISTORY — PX: LEFT HEART CATHETERIZATION WITH CORONARY/GRAFT ANGIOGRAM: SHX5450

## 2014-06-23 LAB — BASIC METABOLIC PANEL
Anion gap: 11 (ref 5–15)
BUN: 16 mg/dL (ref 6–23)
CHLORIDE: 104 meq/L (ref 96–112)
CO2: 25 meq/L (ref 19–32)
Calcium: 9.2 mg/dL (ref 8.4–10.5)
Creatinine, Ser: 1.45 mg/dL — ABNORMAL HIGH (ref 0.50–1.35)
GFR calc Af Amer: 60 mL/min — ABNORMAL LOW (ref >90)
GFR calc non Af Amer: 52 mL/min — ABNORMAL LOW (ref >90)
Glucose, Bld: 104 mg/dL — ABNORMAL HIGH (ref 70–99)
POTASSIUM: 4.6 meq/L (ref 3.7–5.3)
SODIUM: 140 meq/L (ref 137–147)

## 2014-06-23 LAB — CBC
HCT: 41.2 % (ref 39.0–52.0)
HEMOGLOBIN: 13.5 g/dL (ref 13.0–17.0)
MCH: 31.1 pg (ref 26.0–34.0)
MCHC: 32.8 g/dL (ref 30.0–36.0)
MCV: 94.9 fL (ref 78.0–100.0)
PLATELETS: 212 10*3/uL (ref 150–400)
RBC: 4.34 MIL/uL (ref 4.22–5.81)
RDW: 14.5 % (ref 11.5–15.5)
WBC: 8.3 10*3/uL (ref 4.0–10.5)

## 2014-06-23 LAB — TROPONIN I: Troponin I: <0.3 ng/mL (ref <0.30)

## 2014-06-23 LAB — GLUCOSE, CAPILLARY
GLUCOSE-CAPILLARY: 127 mg/dL — AB (ref 70–99)
GLUCOSE-CAPILLARY: 101 mg/dL — AB (ref 70–99)
Glucose-Capillary: 106 mg/dL — ABNORMAL HIGH (ref 70–99)
Glucose-Capillary: 114 mg/dL — ABNORMAL HIGH (ref 70–99)
Glucose-Capillary: 111 mg/dL — ABNORMAL HIGH (ref 70–99)

## 2014-06-23 LAB — PROTIME-INR
INR: 1.05 (ref 0.00–1.49)
Prothrombin Time: 13.7 seconds (ref 11.6–15.2)

## 2014-06-23 LAB — MAGNESIUM: MAGNESIUM: 1.9 mg/dL (ref 1.5–2.5)

## 2014-06-23 LAB — HEPARIN LEVEL (UNFRACTIONATED)
HEPARIN UNFRACTIONATED: 0.29 [IU]/mL — AB (ref 0.30–0.70)
Heparin Unfractionated: 0.4 IU/mL (ref 0.30–0.70)

## 2014-06-23 SURGERY — LEFT HEART CATHETERIZATION WITH CORONARY/GRAFT ANGIOGRAM
Anesthesia: LOCAL

## 2014-06-23 MED ORDER — BUDESONIDE-FORMOTEROL FUMARATE 160-4.5 MCG/ACT IN AERO
2.0000 | INHALATION_SPRAY | Freq: Two times a day (BID) | RESPIRATORY_TRACT | Status: DC
  Administered 2014-06-23 – 2014-06-24 (×3): 2 via RESPIRATORY_TRACT
  Filled 2014-06-23: qty 6

## 2014-06-23 MED ORDER — NITROGLYCERIN 0.4 MG SL SUBL: 0.4000 mg | SUBLINGUAL_TABLET | SUBLINGUAL | Status: DC | PRN

## 2014-06-23 MED ORDER — ASPIRIN EC 81 MG PO TBEC
81.0000 mg | DELAYED_RELEASE_TABLET | Freq: Every day | ORAL | Status: DC
  Administered 2014-06-23 – 2014-06-24 (×2): 81 mg via ORAL
  Filled 2014-06-23 (×2): qty 1

## 2014-06-23 MED ORDER — SODIUM CHLORIDE 0.9 % IV SOLN
1.0000 mL/kg/h | INTRAVENOUS | Status: DC
  Administered 2014-06-23: 1 mL/kg/h via INTRAVENOUS

## 2014-06-23 MED ORDER — ONDANSETRON HCL 4 MG/2ML IJ SOLN: 4.0000 mg | Freq: Four times a day (QID) | INTRAMUSCULAR | Status: DC | PRN

## 2014-06-23 MED ORDER — HEPARIN (PORCINE) IN NACL 2-0.9 UNIT/ML-% IJ SOLN
INTRAMUSCULAR | Status: AC
  Filled 2014-06-23: qty 1000

## 2014-06-23 MED ORDER — METOPROLOL TARTRATE 25 MG PO TABS
25.0000 mg | ORAL_TABLET | Freq: Two times a day (BID) | ORAL | Status: DC
  Administered 2014-06-23 – 2014-06-24 (×2): 25 mg via ORAL
  Filled 2014-06-23 (×4): qty 1

## 2014-06-23 MED ORDER — HEPARIN SODIUM (PORCINE) 1000 UNIT/ML IJ SOLN
INTRAMUSCULAR | Status: AC
Start: 2014-06-23 — End: 2014-06-23
  Filled 2014-06-23: qty 1

## 2014-06-23 MED ORDER — ACETAMINOPHEN 325 MG PO TABS: 650.0000 mg | ORAL_TABLET | ORAL | Status: DC | PRN

## 2014-06-23 MED ORDER — VERAPAMIL HCL 2.5 MG/ML IV SOLN
INTRAVENOUS | Status: AC
  Filled 2014-06-23: qty 2

## 2014-06-23 MED ORDER — SODIUM CHLORIDE 0.9 % IV SOLN: 250.0000 mL | INTRAVENOUS | Status: DC | PRN

## 2014-06-23 MED ORDER — LIDOCAINE HCL (PF) 1 % IJ SOLN
INTRAMUSCULAR | Status: AC
Start: 2014-06-23 — End: 2014-06-23
  Filled 2014-06-23: qty 30

## 2014-06-23 MED ORDER — MIDAZOLAM HCL 2 MG/2ML IJ SOLN
INTRAMUSCULAR | Status: AC
  Filled 2014-06-23: qty 2

## 2014-06-23 MED ORDER — HEPARIN (PORCINE) IN NACL 100-0.45 UNIT/ML-% IJ SOLN
1350.0000 [IU]/h | INTRAMUSCULAR | Status: DC
  Administered 2014-06-23: 1350 [IU]/h via INTRAVENOUS
  Filled 2014-06-23: qty 250

## 2014-06-23 MED ORDER — SUCRALFATE 1 G PO TABS
1.0000 g | ORAL_TABLET | Freq: Three times a day (TID) | ORAL | Status: DC
Start: 2014-06-23 — End: 2014-06-24
  Administered 2014-06-23 – 2014-06-24 (×6): 1 g via ORAL
  Filled 2014-06-23 (×9): qty 1

## 2014-06-23 MED ORDER — FENTANYL CITRATE 0.05 MG/ML IJ SOLN
INTRAMUSCULAR | Status: AC
  Filled 2014-06-23: qty 2

## 2014-06-23 MED ORDER — NITROGLYCERIN 0.2 MG/ML ON CALL CATH LAB
INTRAVENOUS | Status: AC
  Filled 2014-06-23: qty 1

## 2014-06-23 MED ORDER — PANTOPRAZOLE SODIUM 40 MG PO TBEC
40.0000 mg | DELAYED_RELEASE_TABLET | Freq: Every day | ORAL | Status: DC
  Administered 2014-06-23 – 2014-06-24 (×2): 40 mg via ORAL
  Filled 2014-06-23 (×2): qty 1

## 2014-06-23 MED ORDER — INSULIN ASPART 100 UNIT/ML ~~LOC~~ SOLN: 0.0000 [IU] | Freq: Three times a day (TID) | SUBCUTANEOUS | Status: DC

## 2014-06-23 MED ORDER — ASPIRIN 81 MG PO CHEW: 81.0000 mg | CHEWABLE_TABLET | ORAL | Status: DC

## 2014-06-23 MED ORDER — SODIUM CHLORIDE 0.9 % IV SOLN
1.0000 mL/kg/h | INTRAVENOUS | Status: AC
  Administered 2014-06-23: 1 mL/kg/h via INTRAVENOUS

## 2014-06-23 MED ORDER — ATORVASTATIN CALCIUM 40 MG PO TABS
40.0000 mg | ORAL_TABLET | Freq: Every day | ORAL | Status: DC
  Administered 2014-06-23 – 2014-06-24 (×2): 40 mg via ORAL
  Filled 2014-06-23 (×2): qty 1

## 2014-06-23 MED ORDER — INSULIN ASPART 100 UNIT/ML ~~LOC~~ SOLN: 0.0000 [IU] | Freq: Every day | SUBCUTANEOUS | Status: DC

## 2014-06-23 MED ORDER — ALBUTEROL SULFATE (2.5 MG/3ML) 0.083% IN NEBU: 2.5000 mg | INHALATION_SOLUTION | Freq: Four times a day (QID) | RESPIRATORY_TRACT | Status: DC | PRN

## 2014-06-23 MED ORDER — RANOLAZINE ER 500 MG PO TB12
1000.0000 mg | ORAL_TABLET | Freq: Two times a day (BID) | ORAL | Status: DC
  Administered 2014-06-23 – 2014-06-24 (×3): 1000 mg via ORAL
  Filled 2014-06-23 (×4): qty 2

## 2014-06-23 MED ORDER — SODIUM CHLORIDE 0.9 % IJ SOLN: 3.0000 mL | Freq: Two times a day (BID) | INTRAMUSCULAR | Status: DC

## 2014-06-23 MED ORDER — TICAGRELOR 90 MG PO TABS
90.0000 mg | ORAL_TABLET | Freq: Two times a day (BID) | ORAL | Status: DC
  Administered 2014-06-23 – 2014-06-24 (×3): 90 mg via ORAL
  Filled 2014-06-23 (×4): qty 1

## 2014-06-23 MED ORDER — SODIUM CHLORIDE 0.9 % IJ SOLN: 3.0000 mL | INTRAMUSCULAR | Status: DC | PRN

## 2014-06-23 MED ORDER — LISINOPRIL 10 MG PO TABS
10.0000 mg | ORAL_TABLET | Freq: Every day | ORAL | Status: DC
Start: 2014-06-23 — End: 2014-06-24
  Administered 2014-06-23 – 2014-06-24 (×2): 10 mg via ORAL
  Filled 2014-06-23 (×2): qty 1

## 2014-06-23 NOTE — Progress Notes (Signed)
Subjective: No pain now  Objective: Vital signs in last 24 hours: Temp:  [98.1 F (36.7 C)-98.6 F (37 C)] 98.3 F (36.8 C) (09/03 0915) Pulse Rate:  [57-92] 68 (09/03 1026) Resp:  [16-29] 20 (09/03 0915) BP: (105-124)/(58-85) 121/76 mmHg (09/03 1026) SpO2:  [92 %-99 %] 97 % (09/03 0915) Weight:  [223 lb 1.7 oz (101.2 kg)] 223 lb 1.7 oz (101.2 kg) (09/03 0139) Weight change:  Last BM Date: 06/23/14 Intake/Output from previous day: 09/02 0701 - 09/03 0700 In: 99 [I.V.:99] Out: -  Intake/Output this shift: Total I/O In: 240 [P.O.:240] Out: 850 [Urine:850]  PE: General:Pleasant affect, NAD Skin:Warm and dry, brisk capillary refill HEENT:normocephalic, sclera clear, mucus membranes moist Neck:supple, no JVD, no bruits  Heart:S1S2 RRR without murmur, gallup, rub or click Lungs:clear without rales, rhonchi, or wheezes ZOX:WRUE, non tender, + BS, do not palpate liver spleen or masses Ext:no lower ext edema, 2+ pedal pulses, 2+ radial pulses Neuro:alert and oriented, MAE, follows commands, + facial symmetry   Lab Results:  Recent Labs  06/22/14 2211 06/22/14 2222 06/23/14 0533  WBC 9.3  --  8.3  HGB 14.2 15.3 13.5  HCT 42.2 45.0 41.2  PLT 230  --  212   BMET  Recent Labs  06/22/14 2211 06/22/14 2222 06/23/14 0533  NA 140 139 140  K 4.4 4.2 4.6  CL 103 107 104  CO2 23  --  25  GLUCOSE 94 95 104*  BUN CREATININE 1.31 1.40* 1.45*  CALCIUM 9.2  --  9.2    Recent Labs  06/23/14 0533  TROPONINI <0.30    Lab Results  Component Value Date   CHOL 192 08/18/2013   HDL 47 08/18/2013   LDLCALC 123* 08/18/2013   TRIG 112 08/18/2013   CHOLHDL 4.1 08/18/2013   Lab Results  Component Value Date   HGBA1C 5.7* 08/18/2013     No results found for this basename: TSH    Hepatic Function Panel  Recent Labs  06/22/14 2211  PROT 6.8  ALBUMIN 3.7  AST 13  ALT 14  ALKPHOS 58  BILITOT 0.4   No results found for this basename:  CHOL,  in the last 72 hours No results found for this basename: PROTIME,  in the last 72 hours     Studies/Results: Dg Chest Portable 1 View  06/22/2014   CLINICAL DATA:  CHEST PAIN  EXAM: PORTABLE CHEST - 1 VIEW  COMPARISON:  08/17/2013  FINDINGS: Previous CABG.  Mild cardiomegaly, stable  .  Lungs are clear. No effusion.  IMPRESSION: 1. Stable cardiomegaly.  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   Electronically Signed   By: [name] M.D.   On: 06/22/2014 22:53    Medications: I have reviewed the patient's current medications. No current facility-administered medications on file prior to encounter.   Current Outpatient Prescriptions on File Prior to Encounter  Medication Sig Dispense Refill  . aspirin EC 81 MG tablet Take 81 mg by mouth daily.      . nitroGLYCERIN (NITROSTAT) 0.4 MG SL tablet Place 1 tablet (0.4 mg total) under the tongue every 5 (five) minutes x 3 doses as needed for chest pain.  25 tablet  3     Assessment/Plan: 1. Unstable angina s/p CABGx3 in 2012 with subsequent PCI in 2012 and 2015- neg. MI  - ACS is top of differential but considering very stressful event, could be anxiety or Takotsubo.  -  Given high risk for ACS, will keep NPO p MN for cath  - Continue heparin gtt and NTG gtt  - Continue home DAPT  - Continue home beta blocker and ACE-I, and ranexa  - Hold metformin, replace with SSI.  - Repeat enzymes in AM. No echo ordered.  - VS stable  CABG 2012 LIMA-LAD; VG-OM2; VG to Diag.  Since the CABG Xience stent to 1st diag and Promus to another vessel. Last cath at [facility].   2. CAD see above 3. Hyperlipidemia    LOS: 1 day   Time spent with pt. :15 minutes. [facility] R  Nurse Practitioner Certified Pager [phone] or after 5pm and on weekends call [phone] 06/23/2014, 12:10 PM   Patient seen and examined. Agree with assessment and plan.Pt is s/p 3 v CABG in [place] with LIMA to LAD, SVG to dx1, and SVG to OM2 in [id number]. He is s/p DES Xience stent to  Dx1, and also had a Promus Element stent inserted ? Location. He presents with chest pain worrisome for USAP, now improved with NTG and Heparin. Recommend definitive cardiac cath; will try to arrange for later today.   Lennette Bihari, MD, [facility] 06/23/2014 12:33 PM

## 2014-06-23 NOTE — Progress Notes (Signed)
Obtain consent did not specify left vs right catheterization; notified Nada Boozer, NP; Per verbal order left heart catheterization with possible percutaneous intervention.  Hermina Barters, RN

## 2014-06-23 NOTE — H&P (View-Only) (Signed)
       Subjective: No pain now  Objective: Vital signs in last 24 hours: Temp:  [98.1 F (36.7 C)-98.6 F (37 C)] 98.3 F (36.8 C) (09/03 0915) Pulse Rate:  [57-92] 68 (09/03 1026) Resp:  [16-29] 20 (09/03 0915) BP: (105-124)/(58-85) 121/76 mmHg (09/03 1026) SpO2:  [92 %-99 %] 97 % (09/03 0915) Weight:  [223 lb 1.7 oz (101.2 kg)] 223 lb 1.7 oz (101.2 kg) (09/03 0139) Weight change:  Last BM Date: 06/23/14 Intake/Output from previous day: 09/02 0701 - 09/03 0700 In: 99 [I.V.:99] Out: -  Intake/Output this shift: Total I/O In: 240 [P.O.:240] Out: 850 [Urine:850]  PE: General:Pleasant affect, NAD Skin:Warm and dry, brisk capillary refill HEENT:normocephalic, sclera clear, mucus membranes moist Neck:supple, no JVD, no bruits  Heart:S1S2 RRR without murmur, gallup, rub or click Lungs:clear without rales, rhonchi, or wheezes Abd:soft, non tender, + BS, do not palpate liver spleen or masses Ext:no lower ext edema, 2+ pedal pulses, 2+ radial pulses Neuro:alert and oriented, MAE, follows commands, + facial symmetry   Lab Results:  Recent Labs  06/22/14 2211 06/22/14 2222 06/23/14 0533  WBC 9.3  --  8.3  HGB 14.2 15.3 13.5  HCT 42.2 45.0 41.2  PLT 230  --  212   BMET  Recent Labs  06/22/14 2211 06/22/14 2222 06/23/14 0533  NA 140 139 140  K 4.4 4.2 4.6  CL 103 107 104  CO2 23  --  25  GLUCOSE 94 95 104*  BUN 14 13 16  CREATININE 1.31 1.40* 1.45*  CALCIUM 9.2  --  9.2    Recent Labs  06/23/14 0533  TROPONINI <0.30    Lab Results  Component Value Date   CHOL 192 08/18/2013   HDL 47 08/18/2013   LDLCALC 123* 08/18/2013   TRIG 112 08/18/2013   CHOLHDL 4.1 08/18/2013   Lab Results  Component Value Date   HGBA1C 5.7* 08/18/2013     No results found for this basename: TSH    Hepatic Function Panel  Recent Labs  06/22/14 2211  PROT 6.8  ALBUMIN 3.7  AST 13  ALT 14  ALKPHOS 58  BILITOT 0.4   No results found for this basename:  CHOL,  in the last 72 hours No results found for this basename: PROTIME,  in the last 72 hours     Studies/Results: Dg Chest Portable 1 View  06/22/2014   CLINICAL DATA:  CHEST PAIN  EXAM: PORTABLE CHEST - 1 VIEW  COMPARISON:  08/17/2013  FINDINGS: Previous CABG.  Mild cardiomegaly, stable  .  Lungs are clear. No effusion.  IMPRESSION: 1. Stable cardiomegaly.  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   Electronically Signed   By: Daniel  Hassell M.D.   On: 06/22/2014 22:53    Medications: I have reviewed the patient's current medications. No current facility-administered medications on file prior to encounter.   Current Outpatient Prescriptions on File Prior to Encounter  Medication Sig Dispense Refill  . aspirin EC 81 MG tablet Take 81 mg by mouth daily.      . nitroGLYCERIN (NITROSTAT) 0.4 MG SL tablet Place 1 tablet (0.4 mg total) under the tongue every 5 (five) minutes x 3 doses as needed for chest pain.  25 tablet  3     Assessment/Plan: 1. Unstable angina s/p CABGx3 in 2012 with subsequent PCI in 2012 and 2015- neg. MI  - ACS is top of differential but considering very stressful event, could be anxiety or Takotsubo.  -   Given high risk for ACS, will keep NPO p MN for cath  - Continue heparin gtt and NTG gtt  - Continue home DAPT  - Continue home beta blocker and ACE-I, and ranexa  - Hold metformin, replace with SSI.  - Repeat enzymes in AM. No echo ordered.  - VS stable  CABG 2012 LIMA-LAD; VG-OM2; VG to Diag.  Since the CABG Xience stent to 1st diag and Promus to another vessel. Last cath at [facility].   2. CAD see above 3. Hyperlipidemia    LOS: 1 day   Time spent with pt. :15 minutes. [facility] R  Nurse Practitioner Certified Pager [phone] or after 5pm and on weekends call [phone] 06/23/2014, 12:10 PM   Patient seen and examined. Agree with assessment and plan.Pt is s/p 3 v CABG in [place] with LIMA to LAD, SVG to dx1, and SVG to OM2 in [id number]. He is s/p DES Xience stent to  Dx1, and also had a Promus Element stent inserted ? Location. He presents with chest pain worrisome for USAP, now improved with NTG and Heparin. Recommend definitive cardiac cath; will try to arrange for later today.   Lennette Bihari, MD, [facility] 06/23/2014 12:33 PM

## 2014-06-23 NOTE — H&P (Signed)
Admission History and Physical     Patient ID: Roy James, MRN: [id number], DOB: 13-May-1956 58 y.o. Date of Encounter: 06/23/2014, 12:49 AM  Primary Physician: Louie Boston, MD Primary Cardiologist: In [facility]  Chief Complaint: Chest Pain  History of Present Illness: Roy James is a 58 y.o. male s/p CABGx3  in 2012 [facility] [place]) and subsequent PCI, then repeat PCI a few months ago in [place].  He has had stable angina since, with reproducible chest pain when doing yard work on ranexa.  The night prior to admission, the patient's wife had a significant stroke, and is currently admitted to step down.  He had a lot of stress around the event, and a few hours prior to presentation in the ED had severe substernal chest pain with radiation to L shoulder.  Similar to prior MI, but less nausea this time.  He took 3 SL NTG but chest pain remained. His EKG is without acute changes.  He was started on IV heparin gtt and nitroglycerin and now has 2/10 CP.    Past Medical History  Diagnosis Date  . Coronary artery disease     a. s/p CABG, subsequent PCI in 2012 b. "normal" cardiac cath in 2013  . Hypertension   . COPD (chronic obstructive pulmonary disease)   . Type 2 diabetes mellitus with neurological manifestations   . Angina, class III   . Sleep apnea   . GERD (gastroesophageal reflux disease)   . Arthritis   . MI (myocardial infarction)      Past Surgical History  Procedure Laterality Date  . Coronary angioplasty  2012  . Coronary artery bypass graft  2012  . Cardiac catheterization  2013    "normal" per patient      Current Facility-Administered Medications  Medication Dose Route Frequency Provider Last Rate Last Dose  . heparin ADULT infusion 100 units/mL (25000 units/250 mL)  1,200 Units/hr Intravenous Continuous Marquita Palms, RPH 12 mL/hr at 06/22/14 2248 1,200 Units/hr at 06/22/14 2248  . nitroGLYCERIN 50 mg in dextrose 5 % 250 mL (0.2 mg/mL) infusion   2-200 mcg/min Intravenous Titrated Sena Hitch, MD 4.5 mL/hr at 06/22/14 2338 15 mcg/min at 06/22/14 2338   Current Outpatient Prescriptions  Medication Sig Dispense Refill  . albuterol (PROVENTIL HFA;VENTOLIN HFA) 108 (90 BASE) MCG/ACT inhaler Inhale 2 puffs into the lungs every 6 (six) hours as needed for wheezing or shortness of breath.      Marland Kitchen aspirin EC 81 MG tablet Take 81 mg by mouth daily.      Marland Kitchen atorvastatin (LIPITOR) 80 MG tablet Take 40 mg by mouth daily at 6 PM.      . budesonide-formoterol (SYMBICORT) 160-4.5 MCG/ACT inhaler Inhale 2 puffs into the lungs 2 (two) times daily.      . isosorbide mononitrate (IMDUR) 60 MG 24 hr tablet Take 60 mg by mouth daily.      Marland Kitchen lisinopril (PRINIVIL,ZESTRIL) 20 MG tablet Take 10 mg by mouth daily.      . metFORMIN (GLUCOPHAGE) 500 MG tablet Take 500 mg by mouth daily with breakfast.      . metoprolol tartrate (LOPRESSOR) 25 MG tablet Take 25 mg by mouth 2 (two) times daily.      . nitroGLYCERIN (NITROSTAT) 0.4 MG SL tablet Place 1 tablet (0.4 mg total) under the tongue every 5 (five) minutes x 3 doses as needed for chest pain.  25 tablet  3  . omeprazole (PRILOSEC) 20 MG capsule Take 20  mg by mouth daily.      . ranolazine (RANEXA) 1000 MG SR tablet Take 1,000 mg by mouth 2 (two) times daily.      . ranolazine (RANEXA) 500 MG 12 hr tablet Take 1,000 mg by mouth 2 (two) times daily.      . sucralfate (CARAFATE) 1 G tablet Take 1 g by mouth 4 (four) times daily -  with meals and at bedtime.      . ticagrelor (BRILINTA) 90 MG TABS tablet Take 90 mg by mouth 2 (two) times daily.          Allergies: Allergies  Allergen Reactions  . Penicillins Other (See Comments)    unknown     Social History:  The patient  reports that he has never smoked. He has never used smokeless tobacco. He reports that he does not drink alcohol or use illicit drugs.   Family History:  The patient's family history is not on file.   ROS:  Please see the history of  present illness.   All other systems reviewed and negative.   Vital Signs: Blood pressure 118/65, pulse 66, resp. rate 18, SpO2 99.00%.  PHYSICAL EXAM: General:  Well nourished, well developed, in no acute distress HEENT: normal Lymph: no adenopathy Neck: no JVD Endocrine:  No thryomegaly Vascular: No carotid bruits; FA pulses 2+ bilaterally without bruits Cardiac:  normal S1, S2; frequent ectopy; no murmur Lungs:  clear to auscultation bilaterally, no wheezing, rhonchi or rales Abd: soft, nontender, no hepatomegaly Ext: no edema Musculoskeletal:  No deformities, BUE and BLE strength normal and equal Skin: warm and dry Neuro:  CNs 2-12 intact, no focal abnormalities noted Psych:  Normal affect   EKG:  NSR with frequent PAC. No ischemic changes. No Q waves  Labs:   Lab Results  Component Value Date   WBC 9.3 06/22/2014   HGB 15.3 06/22/2014   HCT 45.0 06/22/2014   MCV 94.0 06/22/2014   PLT 230 06/22/2014    Recent Labs Lab 06/22/14 2211 06/22/14 2222  NA 140 139  K 4.4 4.2  CL 103 107  CO2 23  --   BUN 14 13  CREATININE 1.31 1.40*  CALCIUM 9.2  --   PROT 6.8  --   BILITOT 0.4  --   ALKPHOS 58  --   ALT 14  --   AST 13  --   GLUCOSE 94 95   No results found for this basename: CKTOTAL, CKMB, TROPONINI,  in the last 72 hours Lab Results  Component Value Date   CHOL 192 08/18/2013   HDL 47 08/18/2013   LDLCALC 123* 08/18/2013   TRIG 112 08/18/2013   No results found for this basename: DDIMER   Troponin (Point of Care Test)  Recent Labs  06/22/14 2221  TROPIPOC 0.00      Radiology/Studies:  Dg Chest Portable 1 View  06/22/2014   CLINICAL DATA:  CHEST PAIN  EXAM: PORTABLE CHEST - 1 VIEW  COMPARISON:  08/17/2013  FINDINGS: Previous CABG.  Mild cardiomegaly, stable  .  Lungs are clear. No effusion.  IMPRESSION: 1. Stable cardiomegaly.  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   Electronically Signed   By: [name] M.D.   On: 06/22/2014 22:53     ASSESSMENT AND PLAN:     1. Unstable angina s/p CABGx3 in 2012 with subsequent PCI in 2012 and 2015 - ACS is top of differential but considering very stressful event, could be anxiety or Takotsubo. -  Given high risk for ACS, will keep NPO p MN for cath - Continue heparin gtt and NTG gtt - Continue home DAPT - Continue home beta blocker and ACE-I, and ranexa - Hold metformin, replace with SSI. - Repeat enzymes in AM.  No echo ordered.  Signed,  Yaakov Guthrie, MD 06/23/2014, 12:49 AM

## 2014-06-23 NOTE — Progress Notes (Signed)
ANTICOAGULATION CONSULT NOTE - Follow Up Consult  Pharmacy Consult for heparin Indication: chest pain/ACS  Allergies  Allergen Reactions  . Penicillins Other (See Comments)    unknown    Patient Measurements: Height: 6' (182.9 cm) Weight: 223 lb 1.7 oz (101.2 kg) IBW/kg (Calculated) : 77.6 Heparin Dosing Weight: ~98 kg  Vital Signs: Temp: 98 F (36.7 C) (09/03 1336) Temp src: Oral (09/03 1336) BP: 103/65 mmHg (09/03 1336) Pulse Rate: 69 (09/03 1336)  Labs:  Recent Labs  06/22/14 2211 06/22/14 2222 06/23/14 0533 06/23/14 1300  HGB 14.2 15.3 13.5  --   HCT 42.2 45.0 41.2  --   PLT 230  --  212  --   HEPARINUNFRC  --   --  0.40 0.29*  CREATININE 1.31 1.40* 1.45*  --   TROPONINI  --   --  <0.30  --     Estimated Creatinine Clearance: 69.2 ml/min (by C-G formula based on Cr of 1.45).   Medications:  Infusions:  . heparin 1,200 Units/hr (06/22/14 2248)  . nitroGLYCERIN 15 mcg/min (06/22/14 2338)    Assessment: 58 yo male admitted with chest pain.  On IV heparin with heparin level slightly subtherapeutic on 1200 units/hr.  No bleeding or complications noted.  CBC/platelet count fairly stable.  Goal of Therapy:  Heparin level 0.3-0.7 units/ml Monitor platelets by anticoagulation protocol: Yes   Plan:  1. Increase IV heparin to 1350 units/hr. 2. Continue daily heparin level and CBC. 3. F/u plans for anticoagulation after cath lab today.  Tad Moore, BCPS  Clinical Pharmacist Pager [phone]  06/23/2014 2:07 PM

## 2014-06-23 NOTE — Care Management Note (Unsigned)
    Page 1 of 1   06/23/2014     2:13:38 PM CARE MANAGEMENT NOTE 06/23/2014  Patient:  Roy James, Roy James   Account Number:  [id number]  Date Initiated:  06/23/2014  Documentation initiated by:  Crislyn Willbanks  Subjective/Objective Assessment:   Pt adm on 06/22/14 with chest pain.  PTA, pt independent, lives with spouse.     Action/Plan:   Will follow for dc needs as pt progresses.   Anticipated DC Date:  06/24/2014   Anticipated DC Plan:  HOME/SELF CARE      DC Planning Services  CM consult      Choice offered to / List presented to:             Status of service:  In process, will continue to follow Medicare Important Message given?   (If response is "NO", the following Medicare IM given date fields will be blank) Date Medicare IM given:   Medicare IM given by:   Date Additional Medicare IM given:   Additional Medicare IM given by:    Discharge Disposition:    Per UR Regulation:  Reviewed for med. necessity/level of care/duration of stay  If discussed at Long Length of Stay Meetings, dates discussed:    Comments:

## 2014-06-23 NOTE — Interval H&P Note (Signed)
History and Physical Interval Note:  06/23/2014 4:09 PM  Roy James  has presented today for surgery, with the diagnosis of cp  The various methods of treatment have been discussed with the patient and family. After consideration of risks, benefits and other options for treatment, the patient has consented to  Procedure(s): LEFT HEART CATHETERIZATION WITH CORONARY/GRAFT ANGIOGRAM (N/A) as a surgical intervention .  The patient's history has been reviewed, patient examined, no change in status, stable for surgery.  I have reviewed the patient's chart and labs.  Questions were answered to the patient's satisfaction.   Cath Lab Visit (complete for each Cath Lab visit)  Clinical Evaluation Leading to the Procedure:   ACS: Yes.    Non-ACS:    Anginal Classification: CCS IV  Anti-ischemic medical therapy: Maximal Therapy (2 or more classes of medications)  Non-Invasive Test Results: No non-invasive testing performed  Prior CABG: Previous CABG        Theron Arista [facility] 06/23/2014 4:09 PM

## 2014-06-23 NOTE — ED Provider Notes (Signed)
I saw and evaluated the patient, reviewed the resident's note and I agree with the findings and plan.  Please see my separate note regarding my evaluation of the patient   Roy Roller, MD 06/23/14 1425

## 2014-06-23 NOTE — Progress Notes (Signed)
ANTICOAGULATION CONSULT NOTE - Follow Up Consult  Pharmacy Consult for heparin Indication: USAP  Labs:  Recent Labs  06/22/14 2211 06/22/14 2222 06/23/14 0533  HGB 14.2 15.3 13.5  HCT 42.2 45.0 41.2  PLT 230  --  212  HEPARINUNFRC  --   --  0.40  CREATININE 1.31 1.40* 1.45*  TROPONINI  --   --  <0.30    Assessment/Plan:  58yo male therapeutic on heparin with initial dosing for CP. Will continue gtt at current rate and confirm stable with additional level.   Vernard Gambles, PharmD, BCPS  06/23/2014,6:25 AM

## 2014-06-23 NOTE — Progress Notes (Signed)
Utilization review completed. Buzz Axel, RN, BSN. 

## 2014-06-23 NOTE — CV Procedure (Signed)
    Cardiac Catheterization Procedure Note  Name: Roy James MRN: [id number] DOB: April 22, 1956  Procedure: Left Heart Cath, Selective Coronary Angiography, SVG and LIMA angiography, LV angiography  Indication: 58 yo WM s/p CABG and prior coronary stents presents with acute chest pain.   Procedural Details: The left wrist was prepped, draped, and anesthetized with 1% lidocaine. Using the modified Seldinger technique, a 6 French slender sheath was introduced into the left radial artery. 3 mg of verapamil was administered through the sheath, weight-based unfractionated heparin was administered intravenously. Standard Judkins catheters were used for selective coronary angiography, graft,  and left ventriculography. It was very difficult to engage the left coronary from the left radial approach. After trying many catheters we were able to engage with a JL3.5 catheter with considerable difficulty. Catheter exchanges were performed over an exchange length guidewire. There were no immediate procedural complications. A TR band was used for radial hemostasis at the completion of the procedure.  The patient was transferred to the post catheterization recovery area for further monitoring.  Procedural Findings: Hemodynamics: AO 102/62 mean 79 mm Hg LV 104/12 mm Hg  Coronary angiography: Coronary dominance: right  Left mainstem: Normal  Left anterior descending (LAD): The LAD has a 90% stenosis immediately after the first diagonal. The distal LAD fills by the IMA graft. The first diagonal has a stent in place and is widely patent.   Left circumflex (LCx): The LCx gives rise to a large OM. There is a stent in the mid LCx prior to the OM that is widely patent.   Right coronary artery (RCA): Normal.  SVG to the diagonal is occluded  SVG to the OM is occluded  LIMA to the LAD is a large graft and is widely patent.  Left ventriculography: Left ventricular systolic function is normal, LVEF is  estimated at 60-65%, there is no significant mitral regurgitation   Final Conclusions:   1. Single vessel obstructive CAD 2. Patent LIMA to the LAD 3. SVG to diagonal and SVG to the OM are occluded but stents in these vessels are patent. 4. Normal LV function.   Recommendations: Continue medical therapy. Anticipate DC in am.  [name], MDFACC  06/23/2014, 5:15 PM

## 2014-06-24 DIAGNOSIS — I209 Angina pectoris, unspecified: Secondary | ICD-10-CM

## 2014-06-24 DIAGNOSIS — E669 Obesity, unspecified: Secondary | ICD-10-CM

## 2014-06-24 LAB — CBC
HCT: 40.7 % (ref 39.0–52.0)
Hemoglobin: 13.7 g/dL (ref 13.0–17.0)
MCH: 31.7 pg (ref 26.0–34.0)
MCHC: 33.7 g/dL (ref 30.0–36.0)
MCV: 94.2 fL (ref 78.0–100.0)
PLATELETS: 206 10*3/uL (ref 150–400)
RBC: 4.32 MIL/uL (ref 4.22–5.81)
RDW: 14.3 % (ref 11.5–15.5)
WBC: 8.3 10*3/uL (ref 4.0–10.5)

## 2014-06-24 LAB — BASIC METABOLIC PANEL
ANION GAP: 13 (ref 5–15)
BUN: 16 mg/dL (ref 6–23)
CHLORIDE: 101 meq/L (ref 96–112)
CO2: 25 mEq/L (ref 19–32)
Calcium: 9.5 mg/dL (ref 8.4–10.5)
Creatinine, Ser: 1.27 mg/dL (ref 0.50–1.35)
GFR calc Af Amer: 71 mL/min — ABNORMAL LOW (ref >90)
GFR calc non Af Amer: 61 mL/min — ABNORMAL LOW (ref >90)
Glucose, Bld: 125 mg/dL — ABNORMAL HIGH (ref 70–99)
Potassium: 4 mEq/L (ref 3.7–5.3)
SODIUM: 139 meq/L (ref 137–147)

## 2014-06-24 LAB — GLUCOSE, CAPILLARY
Glucose-Capillary: 112 mg/dL — ABNORMAL HIGH (ref 70–99)
Glucose-Capillary: 110 mg/dL — ABNORMAL HIGH (ref 70–99)
Glucose-Capillary: 94 mg/dL (ref 70–99)

## 2014-06-24 MED ORDER — METFORMIN HCL 500 MG PO TABS: 500.0000 mg | ORAL_TABLET | Freq: Every day | ORAL | Status: AC

## 2014-06-24 NOTE — Progress Notes (Signed)
Patient Name: Roy James Date of Encounter: 06/24/2014  Principal Problem:   Unstable angina Active Problems:   Hyperlipidemia   COPD (chronic obstructive pulmonary disease)   Type 2 diabetes mellitus with neurological manifestations   S/P CABG x 3    Patient Profile: 58 yo male w/ hx CAD was admitted 09/03 w/ chest pain, cath w/ patent grafts, med rx.   SUBJECTIVE: Pt is concerned about his wife (in [phone]). He denies chest pain or shortness of breath.  OBJECTIVE Filed Vitals:   06/23/14 2123 06/24/14 0631 06/24/14 0842 06/24/14 1023  BP:  86/47  98/54  Pulse: 71 69  77  Temp:  97.9 F (36.6 C)    TempSrc:  Oral    Resp: 18 18    Height:      Weight:      SpO2: 98% 96% 95%     Intake/Output Summary (Last 24 hours) at 06/24/14 1238 Last data filed at 06/24/14 0730  Gross per 24 hour  Intake    240 ml  Output    650 ml  Net   -410 ml   Filed Weights   06/23/14 0139  Weight: 223 lb 1.7 oz (101.2 kg)    PHYSICAL EXAM General: Well developed, well nourished, male in no acute distress. Head: Normocephalic, atraumatic.  Neck: Supple without bruits, JVD not elevated. Lungs:  Resp regular and unlabored, CTA. Heart: Irregular at times, S1, S2, no S3, S4, soft murmur; no rub. Abdomen: Soft, non-tender, non-distended, BS + x 4.  Extremities: No clubbing, cyanosis, no edema. Left radial cath site without ecchymosis or hematoma Neuro: Alert and oriented X 3. Moves all extremities spontaneously. Psych: Normal affect.  LABS: CBC: Recent Labs  06/22/14 2211  06/23/14 0533 06/24/14 0406  WBC 9.3  --  8.3 8.3  NEUTROABS 6.2  --   --   --   HGB 14.2  < > 13.5 13.7  HCT 42.2  < > 41.2 40.7  MCV 94.0  --  94.9 94.2  PLT 230  --  212 206  < > = values in this interval not displayed. INR: Recent Labs  06/23/14 1257  INR 1.05   Basic Metabolic Panel: Recent Labs  06/22/14 2211 06/22/14 2222 06/23/14 0533 06/23/14 1330  NA 140 139 140  --   K 4.4 4.2  4.6  --   CL 103 107 104  --   CO2 23  --  25  --   GLUCOSE 94 95 104*  --   BUN --   CREATININE 1.31 1.40* 1.45*  --   CALCIUM 9.2  --  9.2  --   MG  --   --   --  1.9   Liver Function Tests: Recent Labs  06/22/14 2211  AST 13  ALT 14  ALKPHOS 58  BILITOT 0.4  PROT 6.8  ALBUMIN 3.7   Cardiac Enzymes: Recent Labs  06/23/14 0533 06/23/14 1330  TROPONINI <0.30 <0.30    Recent Labs  06/22/14 2221  TROPIPOC 0.00   BNP: Pro B Natriuretic peptide (BNP)  Date/Time Value Ref Range Status  06/22/2014 10:11 PM 113.2  0 - 125 pg/mL Final   TELE:  SR, ST, frequent PACs, bigeminy at times.     Radiology/Studies: Dg Chest Portable 1 View 06/22/2014   CLINICAL DATA:  CHEST PAIN  EXAM: PORTABLE CHEST - 1 VIEW  COMPARISON:  08/17/2013  FINDINGS: Previous CABG.  Mild cardiomegaly,  stable  .  Lungs are clear. No effusion.  IMPRESSION: 1. Stable cardiomegaly.  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   Electronically Signed   By: [name] M.D.   On: 06/22/2014 22:53     Current Medications:  . aspirin EC  81 mg Oral Daily  . atorvastatin  40 mg Oral q1800  . budesonide-formoterol  2 puff Inhalation BID  . insulin aspart  0-5 Units Subcutaneous QHS  . insulin aspart  0-9 Units Subcutaneous TID WC  . lisinopril  10 mg Oral Daily  . metoprolol tartrate  25 mg Oral BID  . pantoprazole  40 mg Oral Daily  . ranolazine  1,000 mg Oral BID  . sucralfate  1 g Oral TID WC & HS  . ticagrelor  90 mg Oral BID      ASSESSMENT AND PLAN: Principal Problem:   Unstable angina - s/p cath 09/03 w/  Final Conclusions:  1. Single vessel obstructive CAD  2. Patent LIMA to the LAD  3. SVG to diagonal and SVG to the OM are occluded but stents in these vessels are patent.  4. Normal LV function. Continue medical therapy, on ASA, BB, statin, ranolazine and ticagrelor as at home.   Active Problems:   Hyperlipidemia - continue statin and f/u as OP    COPD (chronic obstructive pulmonary  disease) - CXR OK, no active resp issues    Type 2 diabetes mellitus with neurological manifestations - continue metformin as OP, ADA diet.    S/P CABG x 3 - see above  Plan - d/c home.  Signed, Theodore Demark , PA-C 12:38 PM 06/24/2014   Patient seen and examined. Agree with assessment and plan. F/U bmet today post cath. No recurrent chest pain. Now on increased medical therapy. Probable dc later today.   Lennette Bihari, MD, [facility] 06/24/2014 12:57 PM

## 2014-06-24 NOTE — Discharge Summary (Signed)
CARDIOLOGY DISCHARGE SUMMARY   Patient ID: Roy James MRN: [id number] DOB/AGE: [birth year] 58 y.o.  Admit date: 06/22/2014 Discharge date: 06/24/2014  PCP: Louie Boston, MD Primary Cardiologist: [place]  Primary Discharge Diagnosis: Unstable angina - medical therapy for CAD Secondary Discharge Diagnosis:    Hyperlipidemia   COPD (chronic obstructive pulmonary disease)   Type 2 diabetes mellitus with neurological manifestations   S/P CABG x 3  Procedures: Left Heart Cath, Selective Coronary Angiography, SVG and LIMA angiography, LV angiography   Hospital Course: Roy James is a 58 y.o. male with a history of CAD. His wife was hospitalized in step down and he was here visiting her, when he had onset of chest pain. He was admitted for further evaluation and treatment.   His symptoms were concerning for unstable angina. His symptoms were not relieved by sublingual nitroglycerin x3. He was started on IV heparin and IV nitroglycerin, but his chest pain persisted. He was taken to the cath lab on 09/03.  Cardiac catheterization results are below. The SVG to diagonal and SVG to OM were occluded, but the vessels themselves had been stented and were patent. His EF is preserved. He was held overnight and hydrated.  On 09/04, he was seen by Dr. [name] and all data were reviewed. His chest pain had resolved. He was having no shortness of breath. His renal function had been mildly abnormal prior to cath and a recheck was ordered. His creat went from 1.45-->1.27 on the day of discharge.   He was ambulating without chest pain and feeling well. No further inpatient workup is indicated and he is considered stable for discharge, to followup with his cardiologist in [place] as an outpatient.  Labs:   Lab Results  Component Value Date   WBC 8.3 06/24/2014   HGB 13.7 06/24/2014   HCT 40.7 06/24/2014   MCV 94.2 06/24/2014   PLT 206 06/24/2014    Recent Labs Lab  06/22/14 2211  06/24/14 1306  NA 140  < > 139  K 4.4  < > 4.0  CL 103  < > 101  CO2 23  < > 25  BUN 14  < > 16  CREATININE 1.31  < > 1.27  CALCIUM 9.2  < > 9.5  PROT 6.8  --   --   BILITOT 0.4  --   --   ALKPHOS 58  --   --   ALT 14  --   --   AST 13  --   --   GLUCOSE 94  < > 125*  < > = values in this interval not displayed.  Recent Labs  06/23/14 0533 06/23/14 1330  TROPONINI <0.30 <0.30   Lipid Panel     Component Value Date/Time   CHOL 192 08/18/2013 0452   TRIG 112 08/18/2013 0452   HDL 47 08/18/2013 0452   CHOLHDL 4.1 08/18/2013 0452   VLDL 22 08/18/2013 0452   LDLCALC 123* 08/18/2013 0452    Pro B Natriuretic peptide (BNP)  Date/Time Value Ref Range Status  06/22/2014 10:11 PM 113.2  0 - 125 pg/mL Final    Recent Labs  06/23/14 1257  INR 1.05      Radiology: Dg Chest Portable 1 View 06/22/2014   CLINICAL DATA:  CHEST PAIN  EXAM: PORTABLE CHEST - 1 VIEW  COMPARISON:  08/17/2013  FINDINGS: Previous CABG.  Mild cardiomegaly, stable  .  Lungs are clear. No effusion.  IMPRESSION: 1. Stable  cardiomegaly.  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   Electronically Signed   By: [name] M.D.   On: 06/22/2014 22:53    Cardiac Cath: 06/23/2014 Left mainstem: Normal  Left anterior descending (LAD): The LAD has a 90% stenosis immediately after the first diagonal. The distal LAD fills by the IMA graft. The first diagonal has a stent in place and is widely patent.  Left circumflex (LCx): The LCx gives rise to a large OM. There is a stent in the mid LCx prior to the OM that is widely patent.  Right coronary artery (RCA): Normal.  SVG to the diagonal is occluded  SVG to the OM is occluded  LIMA to the LAD is a large graft and is widely patent.  Left ventriculography: Left ventricular systolic function is normal, LVEF is estimated at 60-65%, there is no significant mitral regurgitation  Final Conclusions:  1. Single vessel obstructive CAD  2. Patent LIMA to the LAD  3. SVG  to diagonal and SVG to the OM are occluded but stents in these vessels are patent.  4. Normal LV function.  Recommendations: Continue medical therapy. Anticipate DC in am.   EKG: Sinus rhythm, minor T-wave abnormalities, unchanged from previous ECG  FOLLOW UP PLANS AND APPOINTMENTS Allergies  Allergen Reactions  . Penicillins Other (See Comments)    unknown     Medication List    STOP taking these medications       nitroGLYCERIN 0.4 MG SL tablet  Commonly known as:  NITROSTAT      TAKE these medications       albuterol 108 (90 BASE) MCG/ACT inhaler  Commonly known as:  PROVENTIL HFA;VENTOLIN HFA  Inhale 2 puffs into the lungs every 6 (six) hours as needed for wheezing or shortness of breath.     aspirin EC 81 MG tablet  Take 81 mg by mouth daily.     atorvastatin 80 MG tablet  Commonly known as:  LIPITOR  Take 40 mg by mouth daily at 6 PM.     budesonide-formoterol 160-4.5 MCG/ACT inhaler  Commonly known as:  SYMBICORT  Inhale 2 puffs into the lungs 2 (two) times daily.     isosorbide mononitrate 60 MG 24 hr tablet  Commonly known as:  IMDUR  Take 60 mg by mouth daily.     lisinopril 20 MG tablet  Commonly known as:  PRINIVIL,ZESTRIL  Take 10 mg by mouth daily.     metFORMIN 500 MG tablet  Commonly known as:  GLUCOPHAGE  Take 1 tablet (500 mg total) by mouth daily with breakfast. Hold for 48 hours, restart on 06/26/2014.     metoprolol tartrate 25 MG tablet  Commonly known as:  LOPRESSOR  Take 25 mg by mouth 2 (two) times daily.     omeprazole 20 MG capsule  Commonly known as:  PRILOSEC  Take 20 mg by mouth daily.     ranolazine 500 MG 12 hr tablet  Commonly known as:  RANEXA  Take 1,000 mg by mouth 2 (two) times daily.     sucralfate 1 G tablet  Commonly known as:  CARAFATE  Take 1 g by mouth 4 (four) times daily -  with meals and at bedtime.     ticagrelor 90 MG Tabs tablet  Commonly known as:  BRILINTA  Take 90 mg by mouth 2 (two) times daily.          Follow-up Information   Follow up with TAPPER,[name], MD. (As needed)  Specialty:  Family Medicine   Contact information:   [address] [place] [address] [phone]      Original note by Theodore Demark PA-C   BRING ALL MEDICATIONS WITH YOU TO FOLLOW UP APPOINTMENTS  Time spent with patient to include physician time: 44 min Signed: Janetta Hora, PA-C 06/24/2014, 4:46 PM Co-Sign MD

## 2014-06-24 NOTE — Discharge Instructions (Signed)
Call your cardiologist in [place] for a followup appointment.  PLEASE REMEMBER TO BRING ALL OF YOUR MEDICATIONS TO EACH OF YOUR FOLLOW-UP OFFICE VISITS.  PLEASE ATTEND ALL SCHEDULED FOLLOW-UP APPOINTMENTS.   Activity: Increase activity slowly as tolerated. You may shower, but no soaking baths (or swimming) for 1 week. No driving for 2 days. No lifting over 5 lbs for 1 week. No sexual activity for 1 week.   You May Return to Work: in 1 week (if applicable)  Wound Care: You may wash cath site gently with soap and water. Keep cath site clean and dry. If you notice pain, swelling, bleeding or pus at your cath site, please call [phone].    Cardiac Cath Site Care Refer to this sheet in the next few weeks. These instructions provide you with information on caring for yourself after your procedure. Your caregiver may also give you more specific instructions. Your treatment has been planned according to current medical practices, but problems sometimes occur. Call your caregiver if you have any problems or questions after your procedure. HOME CARE INSTRUCTIONS  You may shower 24 hours after the procedure. Remove the bandage (dressing) and gently wash the site with plain soap and water. Gently pat the site dry.   Do not apply powder or lotion to the site.   Do not sit in a bathtub, swimming pool, or whirlpool for 5 to 7 days.   No bending, squatting, or lifting anything over 10 pounds (4.5 kg) as directed by your caregiver.   Inspect the site at least twice daily.   Do not drive home if you are discharged the same day of the procedure. Have someone else drive you.   You may drive 24 hours after the procedure unless otherwise instructed by your caregiver.  What to expect:  Any bruising will usually fade within 1 to 2 weeks.   Blood that collects in the tissue (hematoma) may be painful to the touch. It should usually decrease in size and tenderness within 1 to 2 weeks.  SEEK IMMEDIATE  MEDICAL CARE IF:  You have unusual pain at the site or down the affected limb.   You have redness, warmth, swelling, or pain at the site.   You have drainage (other than a small amount of blood on the dressing).   You have chills.   You have a fever or persistent symptoms for more than 72 hours.   You have a fever and your symptoms suddenly get worse.   Your leg becomes pale, cool, tingly, or numb.   You have heavy bleeding from the site. Hold pressure on the site.  Document Released: 11/09/2010 Document Revised: 09/26/2011 Document Reviewed: 11/09/2010 [facility] Patient Information 2012 [place], [place].

## 2014-06-24 NOTE — Progress Notes (Signed)
Pt discharged home. Discharge instructions given & reviewed Eduction discussed  IV dc'd  Tele dc'd  Pt discharged via wheelchair to wife's room on [address].  Belonging sent with patient.

## 2014-09-29 ENCOUNTER — Encounter (HOSPITAL_COMMUNITY): Payer: Self-pay | Admitting: Cardiology

## 2015-05-05 IMAGING — CR DG CHEST 1V PORT
1 series · 1 of 1 positions shown · non-contrast
Comparison: 08/17/2013

CLINICAL DATA: CHEST PAIN

EXAM:
PORTABLE CHEST - 1 VIEW

[AP]
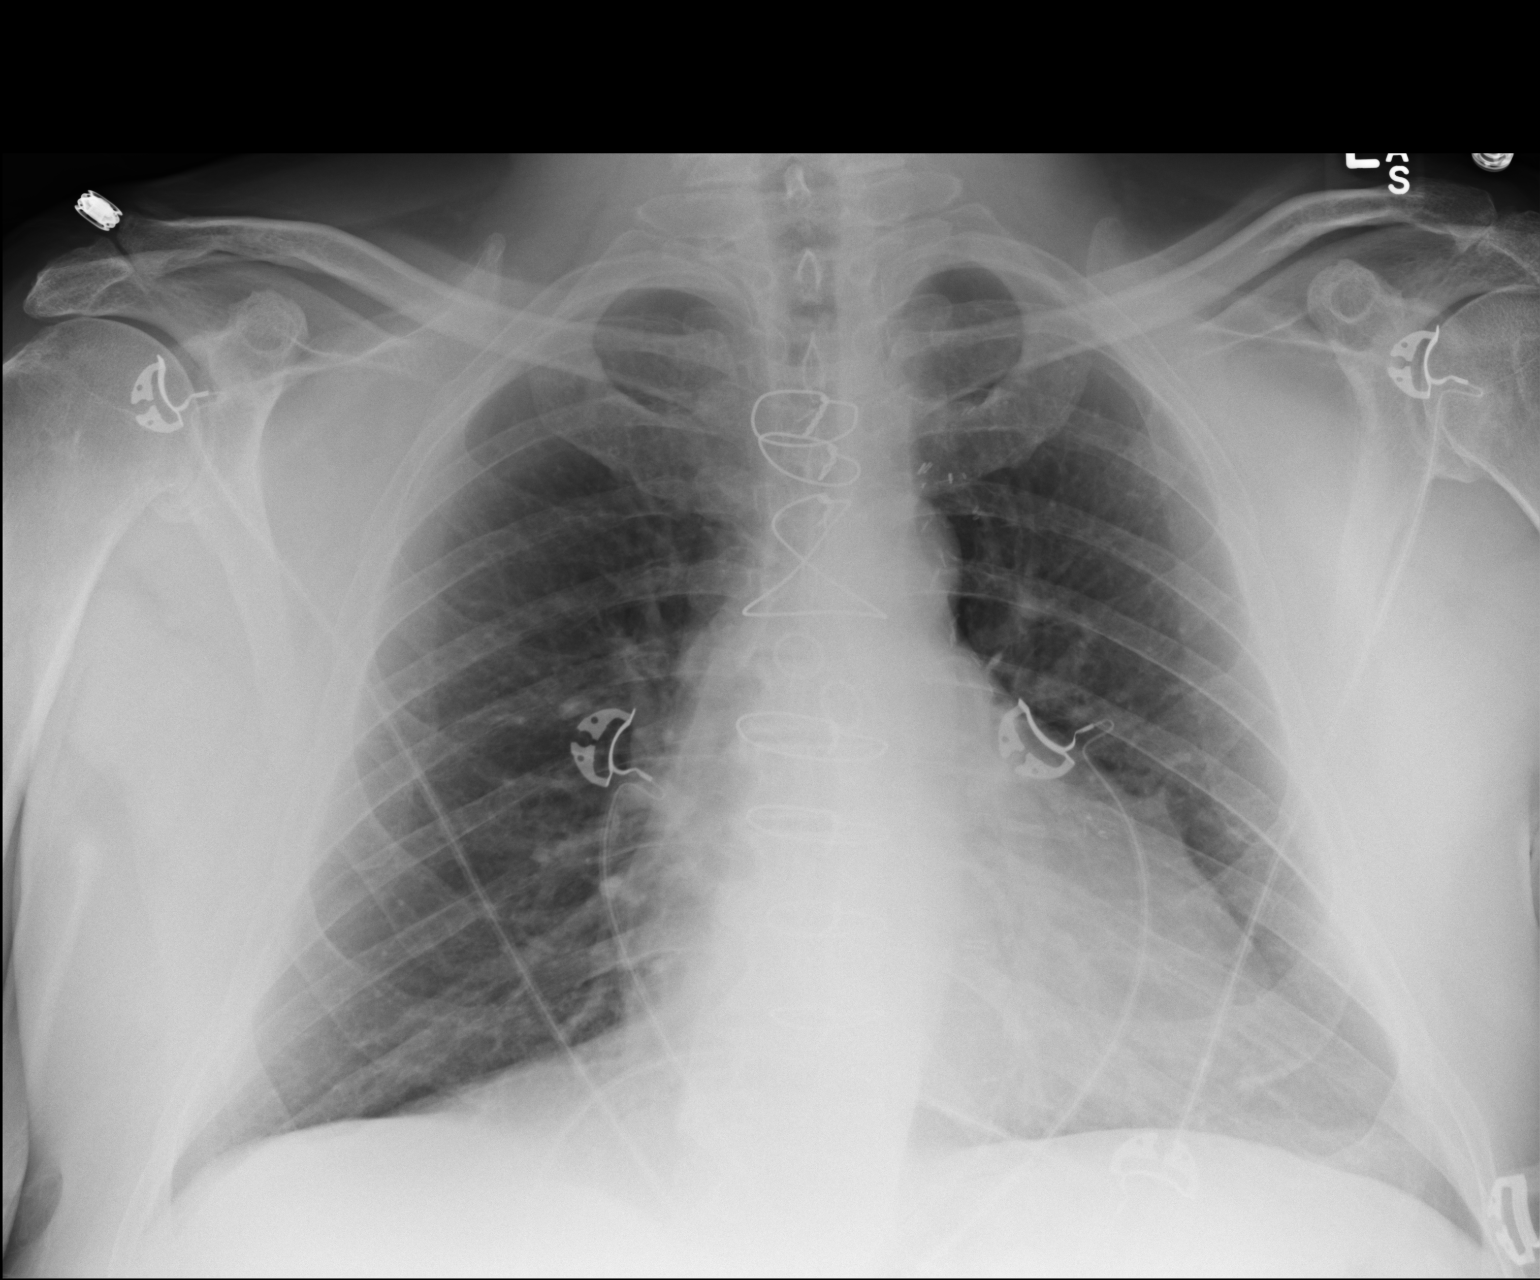

[1 of 1 positions shown; findings below may reference images not displayed]

FINDINGS: Previous CABG.  Mild cardiomegaly, stable  .  Lungs are clear.
No effusion.
IMPRESSION: 1. Stable cardiomegaly.  No acute disease post CABG.
